# Patient Record
Sex: Female | Born: 1960 | Race: White | Hispanic: No | Marital: Married | Smoking: Never smoker
Health system: Southern US, Community
[De-identification: ages and names within clinical notes are randomized; demographics above are authoritative.]

## PROBLEM LIST (undated history)

## (undated) DIAGNOSIS — K589 Irritable bowel syndrome without diarrhea: Secondary | ICD-10-CM

## (undated) DIAGNOSIS — R87619 Unspecified abnormal cytological findings in specimens from cervix uteri: Secondary | ICD-10-CM

## (undated) DIAGNOSIS — S52509A Unspecified fracture of the lower end of unspecified radius, initial encounter for closed fracture: Secondary | ICD-10-CM

## (undated) DIAGNOSIS — R519 Headache, unspecified: Secondary | ICD-10-CM

## (undated) DIAGNOSIS — M75100 Unspecified rotator cuff tear or rupture of unspecified shoulder, not specified as traumatic: Secondary | ICD-10-CM

## (undated) DIAGNOSIS — IMO0002 Reserved for concepts with insufficient information to code with codable children: Secondary | ICD-10-CM

## (undated) DIAGNOSIS — C4491 Basal cell carcinoma of skin, unspecified: Secondary | ICD-10-CM

## (undated) DIAGNOSIS — M419 Scoliosis, unspecified: Secondary | ICD-10-CM

## (undated) DIAGNOSIS — R51 Headache: Secondary | ICD-10-CM

## (undated) DIAGNOSIS — S82899A Other fracture of unspecified lower leg, initial encounter for closed fracture: Secondary | ICD-10-CM

## (undated) HISTORY — DX: Unspecified fracture of the lower end of unspecified radius, initial encounter for closed fracture: S52.509A

## (undated) HISTORY — DX: Unspecified rotator cuff tear or rupture of unspecified shoulder, not specified as traumatic: M75.100

## (undated) HISTORY — PX: RECTAL SURGERY: SHX760

## (undated) HISTORY — PX: MOHS SURGERY: SUR867

## (undated) HISTORY — DX: Reserved for concepts with insufficient information to code with codable children: IMO0002

## (undated) HISTORY — DX: Irritable bowel syndrome, unspecified: K58.9

## (undated) HISTORY — PX: CERVICAL BIOPSY: SHX590

## (undated) HISTORY — DX: Basal cell carcinoma of skin, unspecified: C44.91

## (undated) HISTORY — PX: OTHER SURGICAL HISTORY: SHX169

## (undated) HISTORY — PX: GYNECOLOGIC CRYOSURGERY: SHX857

## (undated) HISTORY — PX: KNEE CARTILAGE SURGERY: SHX688

## (undated) HISTORY — DX: Other fracture of unspecified lower leg, initial encounter for closed fracture: S82.899A

## (undated) HISTORY — DX: Scoliosis, unspecified: M41.9

## (undated) HISTORY — PX: BREAST BIOPSY: SHX20

## (undated) HISTORY — DX: Unspecified abnormal cytological findings in specimens from cervix uteri: R87.619

---

## 1998-01-24 ENCOUNTER — Other Ambulatory Visit: Admission: RE | Admit: 1998-01-24 | Discharge: 1998-01-24 | Payer: Self-pay | Admitting: *Deleted

## 1999-02-16 ENCOUNTER — Other Ambulatory Visit: Admission: RE | Admit: 1999-02-16 | Discharge: 1999-02-16 | Payer: Self-pay | Admitting: *Deleted

## 2000-03-30 ENCOUNTER — Other Ambulatory Visit: Admission: RE | Admit: 2000-03-30 | Discharge: 2000-03-30 | Payer: Self-pay | Admitting: *Deleted

## 2000-06-22 ENCOUNTER — Ambulatory Visit (HOSPITAL_COMMUNITY): Admission: RE | Admit: 2000-06-22 | Discharge: 2000-06-22 | Payer: Self-pay | Admitting: Gastroenterology

## 2001-03-30 ENCOUNTER — Other Ambulatory Visit: Admission: RE | Admit: 2001-03-30 | Discharge: 2001-03-30 | Payer: Self-pay | Admitting: *Deleted

## 2002-04-02 ENCOUNTER — Other Ambulatory Visit: Admission: RE | Admit: 2002-04-02 | Discharge: 2002-04-02 | Payer: Self-pay | Admitting: *Deleted

## 2002-04-16 ENCOUNTER — Encounter: Payer: Self-pay | Admitting: *Deleted

## 2002-04-16 ENCOUNTER — Encounter: Admission: RE | Admit: 2002-04-16 | Discharge: 2002-04-16 | Payer: Self-pay | Admitting: *Deleted

## 2003-04-08 ENCOUNTER — Other Ambulatory Visit: Admission: RE | Admit: 2003-04-08 | Discharge: 2003-04-08 | Payer: Self-pay | Admitting: *Deleted

## 2004-03-10 ENCOUNTER — Encounter: Admission: RE | Admit: 2004-03-10 | Discharge: 2004-03-10 | Payer: Self-pay | Admitting: *Deleted

## 2004-04-08 ENCOUNTER — Other Ambulatory Visit: Admission: RE | Admit: 2004-04-08 | Discharge: 2004-04-08 | Payer: Self-pay | Admitting: *Deleted

## 2005-04-21 ENCOUNTER — Encounter: Admission: RE | Admit: 2005-04-21 | Discharge: 2005-04-21 | Payer: Self-pay | Admitting: Obstetrics and Gynecology

## 2005-05-24 ENCOUNTER — Encounter: Admission: RE | Admit: 2005-05-24 | Discharge: 2005-05-24 | Payer: Self-pay | Admitting: Obstetrics and Gynecology

## 2006-04-28 ENCOUNTER — Encounter: Admission: RE | Admit: 2006-04-28 | Discharge: 2006-04-28 | Payer: Self-pay | Admitting: Obstetrics & Gynecology

## 2006-06-10 ENCOUNTER — Emergency Department (HOSPITAL_COMMUNITY): Admission: EM | Admit: 2006-06-10 | Discharge: 2006-06-10 | Payer: Self-pay | Admitting: Emergency Medicine

## 2007-04-26 ENCOUNTER — Other Ambulatory Visit: Admission: RE | Admit: 2007-04-26 | Discharge: 2007-04-26 | Payer: Self-pay | Admitting: Obstetrics & Gynecology

## 2007-05-01 ENCOUNTER — Encounter: Admission: RE | Admit: 2007-05-01 | Discharge: 2007-05-01 | Payer: Self-pay | Admitting: Obstetrics & Gynecology

## 2007-06-27 ENCOUNTER — Ambulatory Visit (HOSPITAL_COMMUNITY): Admission: RE | Admit: 2007-06-27 | Discharge: 2007-06-27 | Payer: Self-pay | Admitting: Obstetrics & Gynecology

## 2007-06-27 ENCOUNTER — Encounter: Payer: Self-pay | Admitting: Obstetrics & Gynecology

## 2007-09-18 ENCOUNTER — Encounter: Admission: RE | Admit: 2007-09-18 | Discharge: 2007-09-18 | Payer: Self-pay | Admitting: Obstetrics & Gynecology

## 2007-09-20 ENCOUNTER — Encounter: Admission: RE | Admit: 2007-09-20 | Discharge: 2007-09-20 | Payer: Self-pay | Admitting: Obstetrics & Gynecology

## 2007-09-20 ENCOUNTER — Encounter (INDEPENDENT_AMBULATORY_CARE_PROVIDER_SITE_OTHER): Payer: Self-pay | Admitting: Diagnostic Radiology

## 2008-03-06 ENCOUNTER — Encounter: Admission: RE | Admit: 2008-03-06 | Discharge: 2008-03-06 | Payer: Self-pay | Admitting: Obstetrics & Gynecology

## 2008-04-22 ENCOUNTER — Other Ambulatory Visit: Admission: RE | Admit: 2008-04-22 | Discharge: 2008-04-22 | Payer: Self-pay | Admitting: Obstetrics & Gynecology

## 2008-05-01 ENCOUNTER — Encounter: Admission: RE | Admit: 2008-05-01 | Discharge: 2008-05-01 | Payer: Self-pay | Admitting: Obstetrics & Gynecology

## 2008-09-17 ENCOUNTER — Encounter: Admission: RE | Admit: 2008-09-17 | Discharge: 2008-09-17 | Payer: Self-pay | Admitting: Obstetrics and Gynecology

## 2009-05-05 ENCOUNTER — Encounter: Admission: RE | Admit: 2009-05-05 | Discharge: 2009-05-05 | Payer: Self-pay | Admitting: Obstetrics & Gynecology

## 2010-04-13 ENCOUNTER — Other Ambulatory Visit: Payer: Self-pay | Admitting: Obstetrics & Gynecology

## 2010-04-13 DIAGNOSIS — Z1231 Encounter for screening mammogram for malignant neoplasm of breast: Secondary | ICD-10-CM

## 2010-05-12 ENCOUNTER — Inpatient Hospital Stay: Admission: RE | Admit: 2010-05-12 | Payer: Self-pay | Source: Ambulatory Visit

## 2010-05-14 ENCOUNTER — Ambulatory Visit
Admission: RE | Admit: 2010-05-14 | Discharge: 2010-05-14 | Disposition: A | Discharge status: Home / Self Care | Payer: BC Managed Care – PPO | Source: Ambulatory Visit | Attending: Obstetrics & Gynecology | Admitting: Obstetrics & Gynecology

## 2010-05-14 DIAGNOSIS — Z1231 Encounter for screening mammogram for malignant neoplasm of breast: Secondary | ICD-10-CM

## 2010-05-19 NOTE — Op Note (Signed)
NAMETAYVA, EASTERDAY              ACCOUNT NO.:  [id number]   MEDICAL RECORD NO.:  [id number]           PATIENT TYPE:   LOCATION:                                 FACILITY:   PHYSICIAN:  M. Leda Quail, MD  DATE OF BIRTH:  Sep 23, 1960   DATE OF PROCEDURE:  DATE OF DISCHARGE:                               OPERATIVE REPORT   PREOPERATIVE DIAGNOSES:  20. A 50 year old, G2, P86, A1 married white female with menorrhagia.  2. Endometrial polyp.  3. Undesired fertility with her spouse having had a vasectomy.   POSTOPERATIVE DIAGNOSES:  41. 50 year old, G2, P38, A1 married white female with menorrhagia.  2. Endometrial polyp.  3. Undesired fertility with her spouse having had a vasectomy.   PROCEDURE:  Hydrothermal ablation with a hysteroscopy and polyp  resection.   SURGEON:  M. Leda Quail, MD   ASSISTANT:  OR staff.   ANESTHESIA:  LMA.   FINDINGS:  Polyp on the posterior side of the uterus.   SPECIMENS:  Polyp sent to pathology.   EBL:  Minimal.   FLUIDS:  1000 mL of LR.   URINE OUTPUT:  100 mL clear urine, drained with Foley catheter at the  beginning of procedure.   COMPLICATIONS:  None.   INDICATIONS:  Mrs. Castelli is a 50 year old, G2, P64, A1 married white  female with menorrhagia who has been evaluated with a sonohysterogram.  On a sonohysterogram, there was a small endometrial polyp that was  noted.  She did not have any evidence of fibroids or adenomyosis.  We  talked about  treatment options and she felt that an endometrial  ablation was best option for her.  I did recommend pretreatment for 1  month with Depo-Lupron.  On a followup ultrasound as a preop, the  endometrial thickness was quite thin.  There was not clear evidence of  polyp and I felt that we would probably address it with Depo-Lupron.  An  informed consent was present on her chart.  She is ready to proceed.  Her preoperative endometrial biopsy has been performed in May 2009 and  it showed  secretory endometrium.   PROCEDURE:  The patient taken to the operating room.  She was placed in  the supine position.  An LMA anesthesia was then administered by an  Anesthesia without difficulty.  The patient's legs were positioned in  the low lithotomy position in the [place] stirrups.  They were then lifted  to the high lithotomy position.  Perineum, inner thighs, and vagina were  prepped and draped in normal sterile fashion.  Red rubber Foley catheter  was used to drain the bladder of all urine.  A bivalve speculum was  placed in the vagina.  The anterior lip of the cervix was well  visualized.  It was grasped with single-tooth tenaculum.  The uterus was  quite anteflexed.  The cervix was sounded to 8-1/2 cm.  The cervix was  then dilated with Shawnie Pons dilators up to a #21.  At this point, attempt  was made to pass 5-mm hysteroscope with HEA apparatus attached to it  through the  endocervical canal.  This was unsuccessful.  Cervix was then  dilated up to #23.  At this point, the hysteroscope with the HEA  apparatus attach could be slowly passed through the cervix with a good  seal noted.  The cavity was visualized.  Tubal ostia were noted and  photo documentation was made.  The small polyp was noted and photo  documentation was made.  The tip of the scope was then placed towards  the lower uterine segment.  A heating cycle was performed at this point.  The reservoir was initially at 85 mL and reset at 78 mL once fluid was  to ablation temperature of greater than 81 degrees Celsius.  A 10-minute  ablation cycle was performed without difficulty.  The endometrial polyp  shrunk very quickly, as the ablation was being performed.  After 1  minute full cycle was complete, the scope was removed.  A polyp forceps  was obtained and the polyp was grasped and removed with 2 passes of the  polyp forceps.  The hysteroscope was then used to revisualize  endometrial cavity and absence of the polyp was  noted.  Photo  documentation was made.  At this point, the procedure was ended.  The  patient tolerated the procedure well.  Please note, at the beginning of  the procedure, a paracervical block with 1% lidocaine instilled at 3, 6,  9, and 12 o'clock positions was performed.  A 10 mL total was instilled.  At the end of this procedure, sponge, lap, and instrument counts were  correct x2.  There were no needles used on the field.  The Betadine prep  was cleansed off the skin and the patient's legs were positioned back to  the supine position.  She was awakened from anesthesia without  difficulty, and taken to Recovery Room in stable condition.      Lum Keas, MD  Electronically Signed     MSM/MEDQ  D:  06/27/2007  T:  06/28/2007  Job:  [id number]

## 2010-05-22 NOTE — Procedures (Signed)
[facility]  Patient:    Kathryn Burton, Kathryn Burton                     MRN: [id number] Proc. Date: 06/22/00 Adm. Date:  [id number] Attending:  Charna Elizabeth CC:         Andres Ege, M.D.   Procedure Report  DATE OF BIRTH:  October 10, 1959  REFERRING PHYSICIAN:  Andres Ege, M.D.  PROCEDURE PERFORMED:  Colonoscopy.  ENDOSCOPIST:  Anselmo Rod, M.D.  INSTRUMENT USED:  Olympus video colonoscope.  INDICATIONS FOR PROCEDURE:  Family history of colon cancer in a 50 year old white female, rule out colonic polyps, masses, hemorrhoids, etc.  PREPROCEDURE PREPARATION:  Informed consent was procured from the patient. The patient was fasted for eight hours prior to the procedure and prepped with a bottle of magnesium citrate and a gallon of NuLytely the night prior to the procedure.  PREPROCEDURE PHYSICAL:  The patient had stable vital signs.  Neck supple. Chest clear to auscultation.  S1, S2 regular.  Abdomen soft with normal abdominal bowel sounds.  DESCRIPTION OF PROCEDURE:  The patient was placed in the left lateral decubitus position and sedated with 70 mg of Demerol and 9 mg of Versed intravenously.  Once the patient was adequately sedated and maintained on low-flow oxygen and continuous cardiac monitoring, the Olympus video colonoscope was advanced from the rectum to the cecum without difficulty.  The entire colonic mucosa appeared healthy with no evidence of erosions, ulcerations, masses, polyps or hemorrhoids.  The ileocecal valve and appendicular orifice were clearly visualized and photographed.  The patient tolerated the procedure well without complication.  IMPRESSION:  Healthy-appearing colon up to the cecum.  Ileocecal valve and appendicular orifice were clearly visualized and photographed.  RECOMMENDATIONS: 1. Continue high fiber diet. 2. Outpatient follow-up in the next four weeks. 3. repeat colorectal cancer screening in  the next years unless the patient    were to develop any abnormal symptoms in the interim. DD:  06/22/00 TD:  06/22/00 Job: [id number]

## 2010-10-01 LAB — URINE MICROSCOPIC-ADD ON

## 2010-10-01 LAB — PREGNANCY, URINE: Preg Test, Ur: NEGATIVE

## 2010-10-01 LAB — URINALYSIS, ROUTINE W REFLEX MICROSCOPIC
Bilirubin Urine: NEGATIVE
Glucose, UA: NEGATIVE
Protein, ur: NEGATIVE
Specific Gravity, Urine: 1.025
pH: 5.5

## 2010-10-01 LAB — CBC: HCT: 43.2

## 2011-04-06 ENCOUNTER — Other Ambulatory Visit: Payer: Self-pay | Admitting: Obstetrics & Gynecology

## 2011-04-06 DIAGNOSIS — Z1231 Encounter for screening mammogram for malignant neoplasm of breast: Secondary | ICD-10-CM

## 2011-05-17 ENCOUNTER — Ambulatory Visit
Admission: RE | Admit: 2011-05-17 | Discharge: 2011-05-17 | Disposition: A | Discharge status: Home / Self Care | Payer: BC Managed Care – PPO | Source: Ambulatory Visit | Attending: Obstetrics & Gynecology | Admitting: Obstetrics & Gynecology

## 2011-05-17 DIAGNOSIS — Z1231 Encounter for screening mammogram for malignant neoplasm of breast: Secondary | ICD-10-CM

## 2011-12-01 ENCOUNTER — Ambulatory Visit: Payer: BC Managed Care – PPO | Admitting: Orthopedic Surgery

## 2012-05-17 ENCOUNTER — Other Ambulatory Visit: Payer: Self-pay

## 2012-05-17 DIAGNOSIS — Z1231 Encounter for screening mammogram for malignant neoplasm of breast: Secondary | ICD-10-CM

## 2012-06-22 ENCOUNTER — Ambulatory Visit
Admission: RE | Admit: 2012-06-22 | Discharge: 2012-06-22 | Disposition: A | Discharge status: Home / Self Care | Payer: BC Managed Care – PPO | Source: Ambulatory Visit

## 2012-06-22 DIAGNOSIS — Z1231 Encounter for screening mammogram for malignant neoplasm of breast: Secondary | ICD-10-CM

## 2012-09-21 ENCOUNTER — Encounter: Payer: Self-pay | Admitting: Obstetrics & Gynecology

## 2012-11-15 ENCOUNTER — Ambulatory Visit: Payer: Self-pay | Admitting: Obstetrics & Gynecology

## 2012-11-17 ENCOUNTER — Encounter: Payer: Self-pay | Admitting: Obstetrics & Gynecology

## 2012-11-17 ENCOUNTER — Ambulatory Visit: Payer: Self-pay | Admitting: Obstetrics & Gynecology

## 2012-11-20 ENCOUNTER — Encounter: Payer: Self-pay | Admitting: Obstetrics & Gynecology

## 2012-11-20 ENCOUNTER — Ambulatory Visit (INDEPENDENT_AMBULATORY_CARE_PROVIDER_SITE_OTHER): Payer: BC Managed Care – PPO | Admitting: Obstetrics & Gynecology

## 2012-11-20 VITALS — BP 100/64 | HR 64 | Resp 16 | Ht 65.25 in | Wt 128.2 lb

## 2012-11-20 DIAGNOSIS — Z0000 Encounter for general adult medical examination without abnormal findings: Secondary | ICD-10-CM

## 2012-11-20 DIAGNOSIS — Z01419 Encounter for gynecological examination (general) (routine) without abnormal findings: Secondary | ICD-10-CM

## 2012-11-20 NOTE — Progress Notes (Signed)
52 y.o. G2P1 MarriedCaucasianF here for annual exam.  No cycle in four months.  Scant cycle about every six months.  Very little bleeding.  Hot flashes are at times, very hard for her.  Using estroven.  Does help.  Doesn't really want anything else for this.    Son just got married last weekend.  Now ready to proceed with shoulder surgery.  Going to retire in June.    Patient's last menstrual period was 08/04/2012.          Sexually active: yes  The current method of family planning is none.    Exercising: yes  jogging 3-5 x weekly Smoker:  no  Health Maintenance: Pap:  09/29/11 WNL/negative HR HPV History of abnormal Pap:  yes MMG:06/22/12 normal Colonoscopy:  Years ago BMD:   none TDaP:  9/13 Screening Labs: PCP, Hb today: PCP, Urine today: negative   reports that she has never smoked. She has never used smokeless tobacco. She reports that she does not drink alcohol or use illicit drugs.  Past Medical History  Diagnosis Date  . IBS (irritable bowel syndrome)   . Scoliosis   . Abnormal Pap smear   . Basal cell carcinoma   . Torn rotator cuff     right shoulder-had cortisone injections    Past Surgical History  Procedure Laterality Date  . Knee cartilage surgery      right knee  . Rectal surgery      posterior/rectal sphincter repair  . Gynecologic cryosurgery      CIN I  . Cervical biopsy    . Mohs surgery    . Hta      polyp resection  . Breast biopsy      Current Outpatient Prescriptions  Medication Sig Dispense Refill  . diclofenac (VOLTAREN) 75 MG EC tablet as needed.      . Nutritional Supplements (ESTROVEN PO) Take by mouth. With multivitamin      . Probiotic Product (PROBIOTIC DAILY PO) Take by mouth.      . Acetaminophen (TYLENOL PO) Take by mouth as needed.      Marland Kitchen ibuprofen (ADVIL,MOTRIN) 200 MG tablet Take 200 mg by mouth every 6 (six) hours as needed.       No current facility-administered medications for this visit.    Family History  Problem  Relation Age of Onset  . Breast cancer      maternal aunt, 2nd cousin  . Coronary artery disease Father 72    ROS:  Pertinent items are noted in HPI.  Otherwise, a comprehensive ROS was negative.  Exam:   BP 100/64  Pulse 64  Resp 16  Ht 5' 5.25" (1.657 m)  Wt 128 lb 3.2 oz (58.151 kg)  BMI 21.18 kg/m2  LMP 08/04/2012  Weight change: -7lbs  Height: 5' 5.25" (165.7 cm)  Ht Readings from Last 3 Encounters:  11/20/12 5' 5.25" (1.657 m)    General appearance: alert, cooperative and appears stated age Head: Normocephalic, without obvious abnormality, atraumatic Neck: no adenopathy, supple, symmetrical, trachea midline and thyroid normal to inspection and palpation Lungs: clear to auscultation bilaterally Breasts: normal appearance, no masses or tenderness Heart: regular rate and rhythm Abdomen: soft, non-tender; bowel sounds normal; no masses,  no organomegaly Extremities: extremities normal, atraumatic, no cyanosis or edema Skin: Skin color, texture, turgor normal. No rashes or lesions Lymph nodes: Cervical, supraclavicular, and axillary nodes normal. No abnormal inguinal nodes palpated Neurologic: Grossly normal   Pelvic: External genitalia:  no lesions  Urethra:  normal appearing urethra with no masses, tenderness or lesions              Bartholins and Skenes: normal                 Vagina: normal appearing vagina with normal color and discharge, no lesions              Cervix: no lesions              Pap taken: no Bimanual Exam:  Uterus:  normal size, contour, position, consistency, mobility, non-tender              Adnexa: normal adnexa and no mass, fullness, tenderness               Rectovaginal: Confirms               Anus:  normal sphincter tone, no lesions  A:  Well Woman with normal exam Amenorrhea with hx of HTA H/O breast biopsy Shoulder issues--possible rotator cuff Fecal incontinence  P:   Mammogram yearly.  Grade 4 dense breasts. pap smear  with neg HR HPV 9/13. Pt to call when ready this next year for colonoscopy. Labs at work.  Copies in EPIC. Tdap last year. return annually or prn  An After Visit Summary was printed and given to the patient.

## 2012-11-20 NOTE — Patient Instructions (Signed)

## 2012-12-20 ENCOUNTER — Telehealth: Payer: Self-pay | Admitting: Obstetrics & Gynecology

## 2012-12-20 DIAGNOSIS — M25511 Pain in right shoulder: Secondary | ICD-10-CM

## 2012-12-20 NOTE — Telephone Encounter (Signed)
Dr. Hyacinth Meeker, should I enter in referral for shoulder?

## 2012-12-20 NOTE — Telephone Encounter (Signed)
Patient is waiting hear about a referral for her shoulder.

## 2012-12-21 NOTE — Telephone Encounter (Signed)
Spoke with patient and advised I was waiting to hear from Dr. Hyacinth Meeker. She is okay with waiting as she knows that she was waiting to hear from Dr. Hyacinth Meeker regarding who she personally suggests.

## 2012-12-22 NOTE — Telephone Encounter (Signed)
Don't think I can do anything about that but Dr. Juanda Chance is a female and is a good gastroenterologist.  I would suggest her.

## 2012-12-22 NOTE — Telephone Encounter (Signed)
Dr. Hyacinth Meeker, patient states she is ready to go forward with her colonoscopy, states previous colonoscopy with Dr. Loreta Ave and wanted to be seen there. Called Dr. Kenna Gilbert office and state she was discharged from practice in 08/2005 after patient called and stated that she was being seen by another GI provider after getting a reminder for a colonoscopy in 05/2005. What do you recommend.

## 2012-12-22 NOTE — Telephone Encounter (Signed)
Can you see if can make appt for Dr. Amanda Pea at [facility] at [address]?

## 2012-12-22 NOTE — Telephone Encounter (Signed)
Spoke with [facility]. Appointment scheduled for 02/19/13 at 0815 with arrival at 0745 with Dr. Amanda Pea. Advised patient to have records transferred and that would make consult appointment easier for patient.   Patient is agreeable to date/time/location. Advised she is on wait list for earlier appointment.

## 2012-12-27 NOTE — Telephone Encounter (Signed)
LMTCB

## 2013-01-01 ENCOUNTER — Telehealth: Payer: Self-pay | Admitting: Obstetrics & Gynecology

## 2013-01-01 NOTE — Telephone Encounter (Signed)
Patient States she is returning a  Nurses call dont see an open phone note.

## 2013-01-01 NOTE — Telephone Encounter (Signed)
Spoke with patient and referral for Dr. Juanda Chance sent. Patient would like female only provider.

## 2013-01-01 NOTE — Addendum Note (Signed)
Addended by: Joeseph Amor on: 01/01/2013 12:13 PM   Modules accepted: Orders

## 2013-01-01 NOTE — Telephone Encounter (Signed)
Patient is returning call, info regarding colonoscopy appointment in previous phone note.

## 2013-01-01 NOTE — Telephone Encounter (Signed)
Patient called back. Advised was dc from Dr. Loreta Ave, advised if had questions regarding going back to see Dr. Loreta Ave should call her office directly. Patient is agreeable to referral for Dr. Juanda Chance and referral entered to work que.

## 2013-01-03 ENCOUNTER — Encounter: Payer: Self-pay | Admitting: Internal Medicine

## 2013-02-23 ENCOUNTER — Encounter: Payer: Self-pay | Admitting: Internal Medicine

## 2013-02-23 ENCOUNTER — Ambulatory Visit (AMBULATORY_SURGERY_CENTER): Payer: Self-pay | Admitting: *Deleted

## 2013-02-23 VITALS — Ht 65.5 in | Wt 132.2 lb

## 2013-02-23 DIAGNOSIS — Z1211 Encounter for screening for malignant neoplasm of colon: Secondary | ICD-10-CM

## 2013-02-23 MED ORDER — PEG-KCL-NACL-NASULF-NA ASC-C 100 G PO SOLR: ORAL | Status: DC

## 2013-02-23 NOTE — Progress Notes (Signed)
No egg or soy allergy  Release of info (for Dr. Lorie Apley office) filled out and given to [employer]

## 2013-03-02 ENCOUNTER — Telehealth: Payer: Self-pay | Admitting: Internal Medicine

## 2013-03-02 NOTE — Telephone Encounter (Signed)
Left pt message at that number: OK to take Turmeric before procedure.

## 2013-03-09 ENCOUNTER — Encounter: Payer: Self-pay | Admitting: Internal Medicine

## 2013-03-09 ENCOUNTER — Ambulatory Visit (AMBULATORY_SURGERY_CENTER): Payer: BC Managed Care – PPO | Admitting: Internal Medicine

## 2013-03-09 VITALS — BP 119/89 | HR 77 | Temp 97.6°F | Resp 15 | Ht 65.5 in | Wt 132.0 lb

## 2013-03-09 DIAGNOSIS — Z1211 Encounter for screening for malignant neoplasm of colon: Secondary | ICD-10-CM

## 2013-03-09 MED ORDER — SODIUM CHLORIDE 0.9 % IV SOLN: 500.0000 mL | INTRAVENOUS | Status: DC

## 2013-03-09 NOTE — Progress Notes (Signed)
Report to pacu rn, vss, bbs=clear 

## 2013-03-09 NOTE — Patient Instructions (Signed)
YOU HAD AN ENDOSCOPIC PROCEDURE TODAY AT THE [facility]: Refer to the procedure report that was given to you for any specific questions about what was found during the examination.  If the procedure report does not answer your questions, please call your gastroenterologist to clarify.  If you requested that your care partner not be given the details of your procedure findings, then the procedure report has been included in a sealed envelope for you to review at your convenience later.  YOU SHOULD EXPECT: Some feelings of bloating in the abdomen. Passage of more gas than usual.  Walking can help get rid of the air that was put into your GI tract during the procedure and reduce the bloating. If you had a lower endoscopy (such as a colonoscopy or flexible sigmoidoscopy) you may notice spotting of blood in your stool or on the toilet paper. If you underwent a bowel prep for your procedure, then you may not have a normal bowel movement for a few days.  DIET: Your first meal following the procedure should be a light meal and then it is ok to progress to your normal diet.  A half-sandwich or bowl of soup is an example of a good first meal.  Heavy or fried foods are harder to digest and may make you feel nauseous or bloated.  Likewise meals heavy in dairy and vegetables can cause extra gas to form and this can also increase the bloating.  Drink plenty of fluids but you should avoid alcoholic beverages for 24 hours.  ACTIVITY: Your care partner should take you home directly after the procedure.  You should plan to take it easy, moving slowly for the rest of the day.  You can resume normal activity the day after the procedure however you should NOT DRIVE or use heavy machinery for 24 hours (because of the sedation medicines used during the test).    SYMPTOMS TO REPORT IMMEDIATELY: A gastroenterologist can be reached at any hour.  During normal business hours, 8:30 AM to 5:00 PM Monday through Friday,  call [phone].  After hours and on weekends, please call the GI answering service at [phone] who will take a message and have the physician on call contact you.   Following lower endoscopy (colonoscopy or flexible sigmoidoscopy):  Excessive amounts of blood in the stool  Significant tenderness or worsening of abdominal pains  Swelling of the abdomen that is new, acute  Fever of 100F or higher  FOLLOW UP: If any biopsies were taken you will be contacted by phone or by letter within the next 1-3 weeks.  Call your gastroenterologist if you have not heard about the biopsies in 3 weeks.  Our staff will call the home number listed on your records the next business day following your procedure to check on you and address any questions or concerns that you may have at that time regarding the information given to you following your procedure. This is a courtesy call and so if there is no answer at the home number and we have not heard from you through the emergency physician on call, we will assume that you have returned to your regular daily activities without incident.  SIGNATURES/CONFIDENTIALITY: You and/or your care partner have signed paperwork which will be entered into your electronic medical record.  These signatures attest to the fact that that the information above on your After Visit Summary has been reviewed and is understood.  Full responsibility of the confidentiality of this   discharge information lies with you and/or your care-partner.  Recommendations High fiber diet  Repeat colonoscopy in 10 years

## 2013-03-09 NOTE — Progress Notes (Signed)
Patient denies any allergies to eggs or soy. Patient denies any problems with anesthesia.  

## 2013-03-09 NOTE — Op Note (Signed)
[place]  [address]. [place], [address]   COLONOSCOPY PROCEDURE REPORT  PATIENT: Kathryn Burton, Kathryn Burton  MR#: [id number] BIRTHDATE: 1960-10-12 , 52  yrs. old GENDER: Female ENDOSCOPIST: Lafayette Dragon, MD REFERRED IW:[id number] Sabra Heck, M.D., Dr Orson Ape PROCEDURE DATE:  03/09/2013 PROCEDURE:   Colonoscopy, screening First Screening Colonoscopy - Avg.  risk and is 50 yrs.  old or older - No.  Prior Negative Screening - Now for repeat screening. 10 or more years since last screening  History of Adenoma - Now for follow-up colonoscopy & has been > or = to 3 yrs.  N/A  Polyps Removed Today? No.  Recommend repeat exam, <10 yrs? No. ASA CLASS:   Class I INDICATIONS:Average risk patient for colon cancer and titers screening colonoscopy 11 years ago was normal. MEDICATIONS: MAC sedation, administered by CRNA and Propofol (Diprivan) 220 mg IV  DESCRIPTION OF PROCEDURE:   After the risks benefits and alternatives of the procedure were thoroughly explained, informed consent was obtained.  A digital rectal exam revealed no abnormalities of the rectum.   The LB [id number]-[id number]  endoscope was introduced through the anus and advanced to the cecum, which was identified by both the appendix and ileocecal valve. No adverse events experienced.   The quality of the prep was excellent, using MoviPrep  The instrument was then slowly withdrawn as the colon was fully examined.      COLON FINDINGS: A normal appearing cecum, ileocecal valve, and appendiceal orifice were identified.  The ascending, hepatic flexure, transverse, splenic flexure, descending, sigmoid colon and rectum appeared unremarkable.  No polyps or cancers were seen. Retroflexed views revealed no abnormalities. The time to cecum=4 minutes 02 seconds.  Withdrawal time=8 minutes 120 seconds.  The scope was withdrawn and the procedure completed. COMPLICATIONS: There were no complications.  ENDOSCOPIC  IMPRESSION: Normal colon  RECOMMENDATIONS: high fiber diet Recall colonoscopy in 10 years   eSigned:  Lafayette Dragon, MD 03/09/2013 8:57 AM   cc:   PATIENT NAME:  Kathryn Burton, Kathryn Burton MR#: [id number]

## 2013-03-12 ENCOUNTER — Telehealth: Payer: Self-pay

## 2013-03-12 NOTE — Telephone Encounter (Signed)
Left a message on the answering machine # [phone] for the pt to call if any questions or concerns. Maw

## 2013-05-24 ENCOUNTER — Other Ambulatory Visit: Payer: Self-pay

## 2013-05-24 DIAGNOSIS — Z1231 Encounter for screening mammogram for malignant neoplasm of breast: Secondary | ICD-10-CM

## 2013-07-09 ENCOUNTER — Ambulatory Visit
Admission: RE | Admit: 2013-07-09 | Discharge: 2013-07-09 | Disposition: A | Discharge status: Home / Self Care | Payer: BC Managed Care – PPO | Source: Ambulatory Visit

## 2013-07-09 ENCOUNTER — Encounter (INDEPENDENT_AMBULATORY_CARE_PROVIDER_SITE_OTHER): Payer: Self-pay

## 2013-07-09 DIAGNOSIS — Z1231 Encounter for screening mammogram for malignant neoplasm of breast: Secondary | ICD-10-CM

## 2013-07-10 ENCOUNTER — Encounter: Payer: Self-pay | Admitting: Obstetrics & Gynecology

## 2013-07-11 ENCOUNTER — Other Ambulatory Visit: Payer: Self-pay | Admitting: Obstetrics & Gynecology

## 2013-07-11 DIAGNOSIS — R928 Other abnormal and inconclusive findings on diagnostic imaging of breast: Secondary | ICD-10-CM

## 2013-07-18 ENCOUNTER — Ambulatory Visit
Admission: RE | Admit: 2013-07-18 | Discharge: 2013-07-18 | Disposition: A | Discharge status: Home / Self Care | Payer: BC Managed Care – PPO | Source: Ambulatory Visit | Attending: Obstetrics & Gynecology | Admitting: Obstetrics & Gynecology

## 2013-07-18 ENCOUNTER — Other Ambulatory Visit: Payer: Self-pay | Admitting: Obstetrics & Gynecology

## 2013-07-18 DIAGNOSIS — R928 Other abnormal and inconclusive findings on diagnostic imaging of breast: Secondary | ICD-10-CM

## 2013-10-17 DIAGNOSIS — Z0289 Encounter for other administrative examinations: Secondary | ICD-10-CM

## 2013-11-05 ENCOUNTER — Encounter: Payer: Self-pay | Admitting: Internal Medicine

## 2013-11-23 ENCOUNTER — Ambulatory Visit: Payer: BC Managed Care – PPO | Admitting: Obstetrics & Gynecology

## 2013-12-25 ENCOUNTER — Encounter: Payer: Self-pay | Admitting: Obstetrics & Gynecology

## 2013-12-26 ENCOUNTER — Encounter: Payer: Self-pay | Admitting: Obstetrics & Gynecology

## 2013-12-26 ENCOUNTER — Ambulatory Visit (INDEPENDENT_AMBULATORY_CARE_PROVIDER_SITE_OTHER): Payer: BC Managed Care – PPO | Admitting: Obstetrics & Gynecology

## 2013-12-26 VITALS — BP 110/60 | HR 76 | Ht 66.0 in | Wt 140.2 lb

## 2013-12-26 DIAGNOSIS — Z01419 Encounter for gynecological examination (general) (routine) without abnormal findings: Secondary | ICD-10-CM

## 2013-12-26 DIAGNOSIS — Z124 Encounter for screening for malignant neoplasm of cervix: Secondary | ICD-10-CM

## 2013-12-26 NOTE — Progress Notes (Signed)
53 y.o. G2P1 MarriedCaucasianF here for annual exam.  No vaginal bleeding.  Father this year having more issues with afib and shingles in his right eye.  Was taking Estroven with [facility]VI with good success but pt now having trouble finding this one.  Reviewed available products with her today.  Not interested in HRT.  Does have a rotator cuff tear.  Seeing Dr. Amedeo Plenty.  Trying to avoid surgery.  No LMP recorded. Patient has had an ablation.          Sexually active: Yes.    The current method of family planning is Ablation.    Exercising: Yes.    aerobics Smoker:  yes  Health Maintenance: Pap: 09/29/11 NEG HR HPV  History of abnormal Pap:  yes MMG:  07/09/13 Bi-Rads 0; 07/18/13 Bi-Rads 1: Negative; Left Breast Ultrasound Bi-Rads 1: Negative Colonoscopy:  03/09/13 Normal f/u in 10 years, Dr. Olevia Perches TDaP:  9/13  Screening Labs: no done at work   reports that she has never smoked. She has never used smokeless tobacco. She reports that she does not drink alcohol or use illicit drugs.  Past Medical History  Diagnosis Date  . IBS (irritable bowel syndrome)   . Scoliosis   . Abnormal Pap smear   . Basal cell carcinoma   . Torn rotator cuff     right shoulder-had cortisone injections    Past Surgical History  Procedure Laterality Date  . Knee cartilage surgery      right knee  . Rectal surgery      posterior/rectal sphincter repair  . Gynecologic cryosurgery      CIN I  . Cervical biopsy    . Mohs surgery    . Hta      polyp resection  . Breast biopsy      right    Current Outpatient Prescriptions  Medication Sig Dispense Refill  . Acetaminophen (TYLENOL PO) Take by mouth as needed.    . diclofenac (VOLTAREN) 75 MG EC tablet as needed.    Marland Kitchen ibuprofen (ADVIL,MOTRIN) 200 MG tablet Take 200 mg by mouth every 6 (six) hours as needed.    . Nutritional Supplements (ESTROVEN PO) Take by mouth. With multivitamin    . Probiotic Product (PROBIOTIC DAILY PO) Take by mouth.    . TURMERIC PO Take  by mouth daily.     No current facility-administered medications for this visit.    Family History  Problem Relation Age of Onset  . Breast cancer      maternal aunt, 2nd cousin  . Coronary artery disease Father 41  . Colon cancer Paternal Grandmother   . Esophageal cancer Neg Hx   . Stomach cancer Neg Hx   . Rectal cancer Neg Hx     ROS:  Pertinent items are noted in HPI.  Otherwise, a comprehensive ROS was negative.  Exam:   BP 110/60 mmHg  Pulse 76  Ht 5\' 6"  (1.676 m)  Wt 140 lb 3.2 oz (63.594 kg)  BMI 22.64 kg/m2  Weight change: +12#   Height: 5\' 6"  (167.6 cm)  Ht Readings from Last 3 Encounters:  12/26/13 5\' 6"  (1.676 m)  03/09/13 5' 5.5" (1.664 m)  02/23/13 5' 5.5" (1.664 m)    General appearance: alert, cooperative and appears stated age Head: Normocephalic, without obvious abnormality, atraumatic Neck: no adenopathy, supple, symmetrical, trachea midline and thyroid normal to inspection and palpation Lungs: clear to auscultation bilaterally Breasts: normal appearance, no masses or tenderness Heart: regular rate and  rhythm Abdomen: soft, non-tender; bowel sounds normal; no masses,  no organomegaly Extremities: extremities normal, atraumatic, no cyanosis or edema Skin: Skin color, texture, turgor normal. No rashes or lesions Lymph nodes: Cervical, supraclavicular, and axillary nodes normal. No abnormal inguinal nodes palpated Neurologic: Grossly normal   Pelvic: External genitalia:  no lesions              Urethra:  normal appearing urethra with no masses, tenderness or lesions              Bartholins and Skenes: normal                 Vagina: normal appearing vagina with normal color and discharge, no lesions              Cervix: no lesions              Pap taken: Yes.   Bimanual Exam:  Uterus:  normal size, contour, position, consistency, mobility, non-tender              Adnexa: normal adnexa and no mass, fullness, tenderness               Rectovaginal:  Confirms               Anus:  normal sphincter tone, no lesions  Chaperone was present for exam.  A:  Well Woman with normal exam Amenorrhea with hx of HTA H/O breast biopsy Fecal incontinence  P: Mammogram yearly. Grade 4 dense breasts.  Pt aware should consider 3D MMG. pap smear with neg HR HPV 9/13.  Pap today. Labs at work. Copies will be placed in EPIC. return annually or prn

## 2013-12-31 ENCOUNTER — Encounter: Payer: Self-pay | Admitting: Obstetrics & Gynecology

## 2014-01-02 LAB — IPS PAP TEST WITH REFLEX TO HPV

## 2014-01-23 ENCOUNTER — Other Ambulatory Visit: Payer: Self-pay | Admitting: Dermatology

## 2014-02-13 ENCOUNTER — Encounter: Payer: Self-pay | Admitting: Obstetrics & Gynecology

## 2014-05-23 ENCOUNTER — Ambulatory Visit: Payer: BC Managed Care – PPO | Admitting: Obstetrics & Gynecology

## 2014-06-10 ENCOUNTER — Other Ambulatory Visit: Payer: Self-pay

## 2014-06-10 DIAGNOSIS — Z1231 Encounter for screening mammogram for malignant neoplasm of breast: Secondary | ICD-10-CM

## 2014-07-22 ENCOUNTER — Ambulatory Visit
Admission: RE | Admit: 2014-07-22 | Discharge: 2014-07-22 | Disposition: A | Discharge status: Home / Self Care | Payer: BC Managed Care – PPO | Source: Ambulatory Visit

## 2014-07-22 DIAGNOSIS — Z1231 Encounter for screening mammogram for malignant neoplasm of breast: Secondary | ICD-10-CM

## 2014-07-23 ENCOUNTER — Other Ambulatory Visit: Payer: Self-pay | Admitting: Obstetrics & Gynecology

## 2014-07-23 DIAGNOSIS — R928 Other abnormal and inconclusive findings on diagnostic imaging of breast: Secondary | ICD-10-CM

## 2014-07-26 ENCOUNTER — Ambulatory Visit
Admission: RE | Admit: 2014-07-26 | Discharge: 2014-07-26 | Disposition: A | Discharge status: Home / Self Care | Payer: BC Managed Care – PPO | Source: Ambulatory Visit | Attending: Obstetrics & Gynecology | Admitting: Obstetrics & Gynecology

## 2014-07-26 DIAGNOSIS — R928 Other abnormal and inconclusive findings on diagnostic imaging of breast: Secondary | ICD-10-CM

## 2014-12-25 ENCOUNTER — Other Ambulatory Visit: Payer: Self-pay | Admitting: Obstetrics & Gynecology

## 2014-12-25 DIAGNOSIS — N6489 Other specified disorders of breast: Secondary | ICD-10-CM

## 2015-01-28 ENCOUNTER — Other Ambulatory Visit: Payer: Self-pay | Admitting: Obstetrics & Gynecology

## 2015-01-28 ENCOUNTER — Ambulatory Visit
Admission: RE | Admit: 2015-01-28 | Discharge: 2015-01-28 | Disposition: A | Discharge status: Home / Self Care | Payer: BC Managed Care – PPO | Source: Ambulatory Visit | Attending: Obstetrics & Gynecology | Admitting: Obstetrics & Gynecology

## 2015-01-28 DIAGNOSIS — N6489 Other specified disorders of breast: Secondary | ICD-10-CM

## 2015-02-05 ENCOUNTER — Ambulatory Visit
Admission: RE | Admit: 2015-02-05 | Discharge: 2015-02-05 | Disposition: A | Discharge status: Home / Self Care | Payer: BC Managed Care – PPO | Source: Ambulatory Visit | Attending: Obstetrics & Gynecology | Admitting: Obstetrics & Gynecology

## 2015-02-05 DIAGNOSIS — N6489 Other specified disorders of breast: Secondary | ICD-10-CM

## 2015-02-06 ENCOUNTER — Encounter: Payer: Self-pay | Admitting: Obstetrics & Gynecology

## 2015-02-06 ENCOUNTER — Ambulatory Visit (INDEPENDENT_AMBULATORY_CARE_PROVIDER_SITE_OTHER): Payer: BC Managed Care – PPO | Admitting: Obstetrics & Gynecology

## 2015-02-06 VITALS — BP 110/60 | HR 86 | Resp 14 | Ht 65.5 in | Wt 136.0 lb

## 2015-02-06 DIAGNOSIS — N952 Postmenopausal atrophic vaginitis: Secondary | ICD-10-CM | POA: Diagnosis not present

## 2015-02-06 DIAGNOSIS — N941 Unspecified dyspareunia: Secondary | ICD-10-CM | POA: Diagnosis not present

## 2015-02-06 DIAGNOSIS — Z01419 Encounter for gynecological examination (general) (routine) without abnormal findings: Secondary | ICD-10-CM

## 2015-02-06 DIAGNOSIS — Z0000 Encounter for general adult medical examination without abnormal findings: Secondary | ICD-10-CM | POA: Diagnosis not present

## 2015-02-06 LAB — POCT URINALYSIS DIPSTICK
Bilirubin, UA: NEGATIVE
GLUCOSE UA: NEGATIVE
Ketones, UA: NEGATIVE
NITRITE UA: NEGATIVE
Protein, UA: NEGATIVE
RBC UA: NEGATIVE
UROBILINOGEN UA: NEGATIVE
pH, UA: 7

## 2015-02-06 NOTE — Progress Notes (Signed)
55 y.o. G2P1 MarriedCaucasianF here for annual exam.  Doing well.  Had a breast biopsy done yesterday.  Waiting for results.  Pt feeling very anxious to get test results.  Checked while pt was here and these results are not back.  Pt wants to discuss vaginal dryness and pain with intercourse.  She has tried OTC products and olive oil without much success.  She does have some anxiety about using vaginal estrogen products due to concerns about estrogen and breast cancer risks.  Reviewed with pt products that are available at this time including vaginal estrogen, oral estrogens.  Pt has family hx of breast cancer in a maternal aunt and second cousin.  She's always been very anxious about using any estrogen product but she feels this is really getting in the way of her marriage and she does want to continue having a sex life, if possible.  After reviewing products and risks with pt, I feel Osphena would be a good option for her but I am anxious about starting this until she has complete follow-up regarding the breast biopsy and then possible excisional biopsy.   Pt also asks about breast surgeons so I share information from other patients.    Denies vaginal bleeding.   No LMP recorded. Patient has had an ablation.          Sexually active: Yes.    The current method of family planning is none.    Exercising: Yes.    Walking Smoker:  no  Health Maintenance: Pap:  12/26/13 Neg. 09/29/11 Neg. HR HPV:neg History of abnormal Pap:  yes MMG:  01/28/15 Left BIRADS4:Suspicious. Needle Bx: results pending.  Colonoscopy: 03/09/13 Normal - repeat 10 years  BMD:   Never TDaP: 09/2011  Screening Labs: PCP (12/15), Hb today: PCP, Urine today: WBC=Small   reports that she has never smoked. She has never used smokeless tobacco. She reports that she does not drink alcohol or use illicit drugs.  Past Medical History  Diagnosis Date  . IBS (irritable bowel syndrome)   . Scoliosis   . Abnormal Pap smear   . Basal  cell carcinoma   . Torn rotator cuff     right shoulder-had cortisone injections    Past Surgical History  Procedure Laterality Date  . Knee cartilage surgery      right knee  . Rectal surgery      posterior/rectal sphincter repair  . Gynecologic cryosurgery      CIN I  . Cervical biopsy    . Mohs surgery    . Hta      polyp resection  . Breast biopsy      right    Current Outpatient Prescriptions  Medication Sig Dispense Refill  . Acetaminophen (TYLENOL PO) Take by mouth as needed.    . Black Cohosh 160 MG CAPS Take by mouth.    Marland Kitchen ibuprofen (ADVIL,MOTRIN) 200 MG tablet Take 200 mg by mouth every 6 (six) hours as needed.    . Probiotic Product (PROBIOTIC DAILY PO) Take by mouth.    . TURMERIC PO Take by mouth daily.     No current facility-administered medications for this visit.    Family History  Problem Relation Age of Onset  . Breast cancer      maternal aunt, 2nd cousin  . Coronary artery disease Father 14  . Colon cancer Paternal Grandmother   . Esophageal cancer Neg Hx   . Stomach cancer Neg Hx   . Rectal cancer Neg  Hx     ROS:  Pertinent items are noted in HPI.  Otherwise, a comprehensive ROS was negative.  Exam:   BP 110/60 mmHg  Pulse 86  Resp 14  Ht 5' 5.5" (1.664 m)  Wt 136 lb (61.689 kg)  BMI 22.28 kg/m2  Weight change: -4#  Height: 5' 5.5" (166.4 cm)  Ht Readings from Last 3 Encounters:  02/06/15 5' 5.5" (1.664 m)  12/26/13 5\' 6"  (1.676 m)  03/09/13 5' 5.5" (1.664 m)    General appearance: alert, cooperative and appears stated age Head: Normocephalic, without obvious abnormality, atraumatic Neck: no adenopathy, supple, symmetrical, trachea midline and thyroid normal to inspection and palpation Lungs: clear to auscultation bilaterally Breasts: normal appearance, no masses or tenderness Heart: regular rate and rhythm Abdomen: soft, non-tender; bowel sounds normal; no masses,  no organomegaly Extremities: extremities normal, atraumatic, no  cyanosis or edema Skin: Skin color, texture, turgor normal. No rashes or lesions Lymph nodes: Cervical, supraclavicular, and axillary nodes normal. No abnormal inguinal nodes palpated Neurologic: Grossly normal   Pelvic: External genitalia:  no lesions              Urethra:  normal appearing urethra with no masses, tenderness or lesions              Bartholins and Skenes: normal                 Vagina: normal appearing vagina with normal color and discharge, no lesions, atrophic              Cervix: no lesions              Pap taken: No. Bimanual Exam:  Uterus:  normal size, contour, position, consistency, mobility, non-tender              Adnexa: normal adnexa and no mass, fullness, tenderness               Rectovaginal: Confirms               Anus:  normal sphincter tone, no lesions  Chaperone was present for exam.  A:  Well Woman with normal exam Amenorrhea with hx of HTA, menopausal now H/O breast biopsy, awaiting biopsy result from yesterday Fecal incontinence that has improved some with bulking agents Family hx of breast cancer in maternal aunt and second cousin Vaginal atrophic changes with dysparuenia  P: Mammogram yearly. Grade 4 dense breasts. Doing 3D MMG. pap smear with neg HR HPV 9/13. neg pap 2015.  No Pap smear today. Consider Osphena if breast evaluation is all negative but will not start this until breast evaluation has been completed.  Pt is comfortable with this. Plan labs next year return annually or prn  In additional to AEX, about 15 minutes spent in discussion about atrophic changes and options for treatment.

## 2015-02-06 NOTE — Patient Instructions (Addendum)
Osphena 60mg  Premarin or estrace vaginal cream 1/2 to 1 gram vaginally two to three times weekly Vagifem 52meq vaginally twice weekly  Dr. Serita Grammes

## 2015-02-09 DIAGNOSIS — N952 Postmenopausal atrophic vaginitis: Secondary | ICD-10-CM | POA: Insufficient documentation

## 2015-02-09 DIAGNOSIS — N941 Unspecified dyspareunia: Secondary | ICD-10-CM | POA: Insufficient documentation

## 2015-02-20 ENCOUNTER — Other Ambulatory Visit: Payer: Self-pay | Admitting: General Surgery

## 2015-02-20 DIAGNOSIS — N6489 Other specified disorders of breast: Secondary | ICD-10-CM

## 2015-02-21 ENCOUNTER — Encounter (HOSPITAL_BASED_OUTPATIENT_CLINIC_OR_DEPARTMENT_OTHER): Payer: Self-pay | Admitting: *Deleted

## 2015-02-21 ENCOUNTER — Other Ambulatory Visit: Payer: Self-pay | Admitting: General Surgery

## 2015-02-21 DIAGNOSIS — N6489 Other specified disorders of breast: Secondary | ICD-10-CM

## 2015-02-25 ENCOUNTER — Ambulatory Visit
Admission: RE | Admit: 2015-02-25 | Discharge: 2015-02-25 | Disposition: A | Discharge status: Home / Self Care | Payer: BC Managed Care – PPO | Source: Ambulatory Visit | Attending: General Surgery | Admitting: General Surgery

## 2015-02-25 ENCOUNTER — Encounter (HOSPITAL_BASED_OUTPATIENT_CLINIC_OR_DEPARTMENT_OTHER): Payer: Self-pay

## 2015-02-25 ENCOUNTER — Ambulatory Visit (HOSPITAL_BASED_OUTPATIENT_CLINIC_OR_DEPARTMENT_OTHER): Payer: BC Managed Care – PPO | Admitting: Anesthesiology

## 2015-02-25 ENCOUNTER — Ambulatory Visit (HOSPITAL_BASED_OUTPATIENT_CLINIC_OR_DEPARTMENT_OTHER)
Admission: RE | Admit: 2015-02-25 | Discharge: 2015-02-25 | Disposition: A | Discharge status: Home / Self Care | Payer: BC Managed Care – PPO | Source: Ambulatory Visit | Attending: General Surgery | Admitting: General Surgery

## 2015-02-25 ENCOUNTER — Encounter (HOSPITAL_BASED_OUTPATIENT_CLINIC_OR_DEPARTMENT_OTHER)
Admission: RE | Disposition: A | Discharge status: Home / Self Care | Payer: Self-pay | Source: Ambulatory Visit | Attending: General Surgery

## 2015-02-25 DIAGNOSIS — M1990 Unspecified osteoarthritis, unspecified site: Secondary | ICD-10-CM | POA: Diagnosis not present

## 2015-02-25 DIAGNOSIS — N6489 Other specified disorders of breast: Secondary | ICD-10-CM

## 2015-02-25 DIAGNOSIS — N63 Unspecified lump in breast: Secondary | ICD-10-CM | POA: Diagnosis present

## 2015-02-25 DIAGNOSIS — Z791 Long term (current) use of non-steroidal anti-inflammatories (NSAID): Secondary | ICD-10-CM | POA: Diagnosis not present

## 2015-02-25 HISTORY — DX: Headache, unspecified: R51.9

## 2015-02-25 HISTORY — PX: BREAST LUMPECTOMY WITH RADIOACTIVE SEED LOCALIZATION: SHX6424

## 2015-02-25 HISTORY — DX: Headache: R51

## 2015-02-25 HISTORY — PX: BREAST EXCISIONAL BIOPSY: SUR124

## 2015-02-25 SURGERY — BREAST LUMPECTOMY WITH RADIOACTIVE SEED LOCALIZATION
Anesthesia: General | Site: Breast | Laterality: Left

## 2015-02-25 MED ORDER — DEXAMETHASONE SODIUM PHOSPHATE 10 MG/ML IJ SOLN
INTRAMUSCULAR | Status: AC
  Filled 2015-02-25: qty 1

## 2015-02-25 MED ORDER — CEFAZOLIN SODIUM-DEXTROSE 2-3 GM-% IV SOLR
INTRAVENOUS | Status: AC
  Filled 2015-02-25: qty 50

## 2015-02-25 MED ORDER — MIDAZOLAM HCL 2 MG/2ML IJ SOLN
1.0000 mg | INTRAMUSCULAR | Status: DC | PRN
  Administered 2015-02-25: 2 mg via INTRAVENOUS

## 2015-02-25 MED ORDER — SCOPOLAMINE 1 MG/3DAYS TD PT72: 1.0000 | MEDICATED_PATCH | Freq: Once | TRANSDERMAL | Status: DC | PRN

## 2015-02-25 MED ORDER — FENTANYL CITRATE (PF) 100 MCG/2ML IJ SOLN
50.0000 ug | INTRAMUSCULAR | Status: DC | PRN
  Administered 2015-02-25: 50 ug via INTRAVENOUS

## 2015-02-25 MED ORDER — GLYCOPYRROLATE 0.2 MG/ML IJ SOLN: 0.2000 mg | Freq: Once | INTRAMUSCULAR | Status: DC | PRN

## 2015-02-25 MED ORDER — ONDANSETRON HCL 4 MG/2ML IJ SOLN
INTRAMUSCULAR | Status: AC
  Filled 2015-02-25: qty 2

## 2015-02-25 MED ORDER — LACTATED RINGERS IV SOLN
INTRAVENOUS | Status: DC
  Administered 2015-02-25 (×3): via INTRAVENOUS

## 2015-02-25 MED ORDER — CEFAZOLIN SODIUM-DEXTROSE 2-3 GM-% IV SOLR
2.0000 g | INTRAVENOUS | Status: AC
  Administered 2015-02-25: 2 g via INTRAVENOUS

## 2015-02-25 MED ORDER — LACTATED RINGERS IV SOLN: 500.0000 mL | INTRAVENOUS | Status: DC

## 2015-02-25 MED ORDER — DEXAMETHASONE SODIUM PHOSPHATE 4 MG/ML IJ SOLN
INTRAMUSCULAR | Status: DC | PRN
  Administered 2015-02-25: 10 mg via INTRAVENOUS

## 2015-02-25 MED ORDER — MIDAZOLAM HCL 2 MG/2ML IJ SOLN
INTRAMUSCULAR | Status: AC
  Filled 2015-02-25: qty 2

## 2015-02-25 MED ORDER — FENTANYL CITRATE (PF) 100 MCG/2ML IJ SOLN
INTRAMUSCULAR | Status: AC
  Filled 2015-02-25: qty 2

## 2015-02-25 MED ORDER — ONDANSETRON HCL 4 MG/2ML IJ SOLN
INTRAMUSCULAR | Status: DC | PRN
  Administered 2015-02-25: 4 mg via INTRAVENOUS

## 2015-02-25 MED ORDER — HYDROCODONE-ACETAMINOPHEN 5-325 MG PO TABS: 1.0000 | ORAL_TABLET | Freq: Four times a day (QID) | ORAL | Status: DC | PRN

## 2015-02-25 MED ORDER — BUPIVACAINE HCL (PF) 0.25 % IJ SOLN
INTRAMUSCULAR | Status: DC | PRN
  Administered 2015-02-25: 10 mL

## 2015-02-25 MED ORDER — PROPOFOL 10 MG/ML IV BOLUS
INTRAVENOUS | Status: DC | PRN
  Administered 2015-02-25: 150 mg via INTRAVENOUS

## 2015-02-25 MED ORDER — LIDOCAINE HCL (CARDIAC) 20 MG/ML IV SOLN
INTRAVENOUS | Status: DC | PRN
  Administered 2015-02-25: 80 mg via INTRAVENOUS

## 2015-02-25 MED ORDER — LIDOCAINE HCL (CARDIAC) 20 MG/ML IV SOLN
INTRAVENOUS | Status: AC
  Filled 2015-02-25: qty 5

## 2015-02-25 MED ORDER — PROPOFOL 10 MG/ML IV BOLUS
INTRAVENOUS | Status: AC
  Filled 2015-02-25: qty 20

## 2015-02-25 SURGICAL SUPPLY — 55 items
APPLIER CLIP 9.375 MED OPEN (MISCELLANEOUS)
BINDER BREAST LRG (GAUZE/BANDAGES/DRESSINGS) IMPLANT
BINDER BREAST MEDIUM (GAUZE/BANDAGES/DRESSINGS) ×2 IMPLANT
BINDER BREAST XLRG (GAUZE/BANDAGES/DRESSINGS) IMPLANT
BINDER BREAST XXLRG (GAUZE/BANDAGES/DRESSINGS) IMPLANT
BLADE SURG 15 STRL LF DISP TIS (BLADE) ×1 IMPLANT
BLADE SURG 15 STRL SS (BLADE) ×1
CANISTER SUC SOCK COL 7IN (MISCELLANEOUS) IMPLANT
CANISTER SUCT 1200ML W/VALVE (MISCELLANEOUS) IMPLANT
CHLORAPREP W/TINT 26ML (MISCELLANEOUS) ×2 IMPLANT
CLIP APPLIE 9.375 MED OPEN (MISCELLANEOUS) IMPLANT
COVER BACK TABLE 60X90IN (DRAPES) ×2 IMPLANT
COVER MAYO STAND STRL (DRAPES) ×2 IMPLANT
COVER PROBE W GEL 5X96 (DRAPES) ×2 IMPLANT
DECANTER SPIKE VIAL GLASS SM (MISCELLANEOUS) IMPLANT
DEVICE DUBIN W/COMP PLATE 8390 (MISCELLANEOUS) ×2 IMPLANT
DRAPE LAPAROSCOPIC ABDOMINAL (DRAPES) ×2 IMPLANT
DRAPE UTILITY XL STRL (DRAPES) ×2 IMPLANT
DRSG TEGADERM 4X4.75 (GAUZE/BANDAGES/DRESSINGS) IMPLANT
ELECT COATED BLADE 2.86 ST (ELECTRODE) ×2 IMPLANT
ELECT REM PT RETURN 9FT ADLT (ELECTROSURGICAL) ×2
ELECTRODE REM PT RTRN 9FT ADLT (ELECTROSURGICAL) ×1 IMPLANT
GLOVE BIO SURGEON STRL SZ7 (GLOVE) ×4 IMPLANT
GLOVE BIOGEL PI IND STRL 6.5 (GLOVE) ×2 IMPLANT
GLOVE BIOGEL PI IND STRL 7.5 (GLOVE) ×1 IMPLANT
GLOVE BIOGEL PI INDICATOR 6.5 (GLOVE) ×2
GLOVE BIOGEL PI INDICATOR 7.5 (GLOVE) ×1
GLOVE ECLIPSE 6.5 STRL STRAW (GLOVE) ×2 IMPLANT
GOWN STRL REUS W/ TWL LRG LVL3 (GOWN DISPOSABLE) ×2 IMPLANT
GOWN STRL REUS W/TWL LRG LVL3 (GOWN DISPOSABLE) ×2
ILLUMINATOR WAVEGUIDE N/F (MISCELLANEOUS) ×2 IMPLANT
KIT MARKER MARGIN INK (KITS) ×2 IMPLANT
LIGHT WAVEGUIDE WIDE FLAT (MISCELLANEOUS) IMPLANT
LIQUID BAND (GAUZE/BANDAGES/DRESSINGS) ×2 IMPLANT
NEEDLE HYPO 25X1 1.5 SAFETY (NEEDLE) ×2 IMPLANT
NS IRRIG 1000ML POUR BTL (IV SOLUTION) IMPLANT
PACK BASIN DAY SURGERY FS (CUSTOM PROCEDURE TRAY) ×2 IMPLANT
PENCIL BUTTON HOLSTER BLD 10FT (ELECTRODE) ×2 IMPLANT
SLEEVE SCD COMPRESS KNEE MED (MISCELLANEOUS) ×2 IMPLANT
SPONGE GAUZE 4X4 12PLY STER LF (GAUZE/BANDAGES/DRESSINGS) IMPLANT
SPONGE LAP 4X18 X RAY DECT (DISPOSABLE) ×2 IMPLANT
STRIP CLOSURE SKIN 1/2X4 (GAUZE/BANDAGES/DRESSINGS) ×2 IMPLANT
SUT MNCRL AB 4-0 PS2 18 (SUTURE) ×2 IMPLANT
SUT MON AB 5-0 PS2 18 (SUTURE) ×2 IMPLANT
SUT SILK 2 0 SH (SUTURE) IMPLANT
SUT VIC AB 2-0 SH 27 (SUTURE) ×1
SUT VIC AB 2-0 SH 27XBRD (SUTURE) ×1 IMPLANT
SUT VIC AB 3-0 SH 27 (SUTURE) ×1
SUT VIC AB 3-0 SH 27X BRD (SUTURE) ×1 IMPLANT
SUT VIC AB 5-0 PS2 18 (SUTURE) IMPLANT
SYR CONTROL 10ML LL (SYRINGE) ×2 IMPLANT
TOWEL OR 17X24 6PK STRL BLUE (TOWEL DISPOSABLE) ×2 IMPLANT
TOWEL OR NON WOVEN STRL DISP B (DISPOSABLE) ×2 IMPLANT
TUBE CONNECTING 20X1/4 (TUBING) IMPLANT
YANKAUER SUCT BULB TIP NO VENT (SUCTIONS) IMPLANT

## 2015-02-25 NOTE — Transfer of Care (Signed)
Immediate Anesthesia Transfer of Care Note  Patient: Kathryn Burton  Procedure(s) Performed: Procedure(s): LEFT BREAST LUMPECTOMY WITH RADIOACTIVE SEED LOCALIZATION (Left)  Patient Location: PACU  Anesthesia Type:General  Level of Consciousness: awake, sedated and responds to stimulation  Airway & Oxygen Therapy: Patient Spontanous Breathing and Patient connected to face mask oxygen  Post-op Assessment: Report given to RN and Post -op Vital signs reviewed and stable  Post vital signs: Reviewed and stable  Last Vitals:  Filed Vitals:   02/25/15 0954  BP: 124/73  Pulse: 82  Temp: 36.7 C  Resp: 16    Complications: No apparent anesthesia complications

## 2015-02-25 NOTE — Anesthesia Postprocedure Evaluation (Signed)
Anesthesia Post Note  Patient: Kathryn Burton  Procedure(s) Performed: Procedure(s) (LRB): LEFT BREAST LUMPECTOMY WITH RADIOACTIVE SEED LOCALIZATION (Left)  Patient location during evaluation: PACU Anesthesia Type: General Level of consciousness: awake and alert Pain management: pain level controlled Vital Signs Assessment: post-procedure vital signs reviewed and stable Respiratory status: spontaneous breathing, nonlabored ventilation and respiratory function stable Cardiovascular status: blood pressure returned to baseline and stable Postop Assessment: no signs of nausea or vomiting Anesthetic complications: no    Last Vitals:  Filed Vitals:   02/25/15 1230 02/25/15 1315  BP: 116/78 117/83  Pulse:    Temp:  36.4 C  Resp:  14    Last Pain: There were no vitals filed for this visit.               Jacquez Sheetz,W. EDMOND

## 2015-02-25 NOTE — Anesthesia Preprocedure Evaluation (Signed)
Anesthesia Evaluation  Patient identified by MRN, date of birth, ID band Patient awake    Reviewed: Allergy & Precautions, NPO status , Patient's Chart, lab work & pertinent test results  History of Anesthesia Complications Negative for: history of anesthetic complications  Airway Mallampati: II  TM Distance: >3 FB Neck ROM: Full    Dental no notable dental hx. (+) Dental Advisory Given   Pulmonary neg pulmonary ROS,    Pulmonary exam normal breath sounds clear to auscultation       Cardiovascular negative cardio ROS Normal cardiovascular exam Rhythm:Regular Rate:Normal     Neuro/Psych  Headaches, negative psych ROS   GI/Hepatic negative GI ROS, Neg liver ROS,   Endo/Other  negative endocrine ROS  Renal/GU negative Renal ROS  negative genitourinary   Musculoskeletal  (+) Arthritis ,   Abdominal   Peds negative pediatric ROS (+)  Hematology negative hematology ROS (+)   Anesthesia Other Findings   Reproductive/Obstetrics negative OB ROS                             Anesthesia Physical Anesthesia Plan  ASA: II  Anesthesia Plan: General   Post-op Pain Management:    Induction: Intravenous  Airway Management Planned: LMA  Additional Equipment:   Intra-op Plan:   Post-operative Plan: Extubation in OR  Informed Consent: I have reviewed the patients History and Physical, chart, labs and discussed the procedure including the risks, benefits and alternatives for the proposed anesthesia with the patient or authorized representative who has indicated his/her understanding and acceptance.   Dental advisory given  Plan Discussed with: CRNA  Anesthesia Plan Comments:         Anesthesia Quick Evaluation

## 2015-02-25 NOTE — Interval H&P Note (Signed)
History and Physical Interval Note:  02/25/2015 10:58 AM  Kathryn Burton  has presented today for surgery, with the diagnosis of left breast radial scar  The various methods of treatment have been discussed with the patient and family. After consideration of risks, benefits and other options for treatment, the patient has consented to  Procedure(s): LEFT BREAST LUMPECTOMY WITH RADIOACTIVE SEED LOCALIZATION (Left) as a surgical intervention .  The patient's history has been reviewed, patient examined, no change in status, stable for surgery.  I have reviewed the patient's chart and labs.  Questions were answered to the patient's satisfaction.     Keri Veale

## 2015-02-25 NOTE — Op Note (Signed)
Preoperative diagnosis: Right breast mass, core biopsy c/w csl Postoperative diagnosis: same as above Procedure: Right breast mass seed guided excisional biopsy Surgeon: Dr Serita Grammes Anesthesia: general EBL: minimal Drains none Complications none Specimen Right breast tissue marked with paint Sponge and needle count was correct to completion disposition to recovery stable  Indications: This is a 53 yof with right breast mammographic abnormality that underwent core biopsy and was a csl.  Her clip is about 1.2 cm away from the lesion/biopsy site.  We discussed options and decided to excise this with seed guidance. The biopsy site not the clip was localized.    Procedure: After informed consent was obtained the patient was taken to the operating room. She was given antibiotics. Sequential compression devices were on her legs. She was then placed under general anesthesia without complication. She was prepped and draped in the standard sterile surgical fashion. A surgical timeout was then performed. I had the mammograms in the room  I infiltrated 1% lidocaine and made a periareolar incision after locating the seed. I then removed the seed and surrounding tissue. The seed was confirmed to be removed by mammography. The clip was not present but I was not going to remove more tissue just to remove this as it had migrated and this is benign lesion for now. Hemostasis was observed. I painted the specimen. . I then closed this with 2-0 vicryl 3-0 Vicryl, and 5-0 Monocryl. Glue was placed over the wound.  She tolerated this well was extubated and transferred to recovery in stable condition

## 2015-02-25 NOTE — H&P (Signed)
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 there is possible distortion in left breast that showed up in July then was followed up now. this area was present in december without [place] correlate and a tomo biopsy was done. The coil clip is 1.3 cm superior to the area. 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 she has no complaints referable to either breast. she has no family history. she is here with her husband today   Other Problems Marjean Donna, CMA; 02/20/2015 9:36 AM) No pertinent past medical history  Past Surgical History Marjean Donna, CMA; 02/20/2015 9:36 AM) Breast Biopsy Bilateral. Knee Surgery Right. Oral Surgery  Diagnostic Studies History Marjean Donna, CMA; 02/20/2015 9:36 AM) Colonoscopy within last year Mammogram within last year  Allergies Marjean Donna, CMA; 02/20/2015 9:37 AM) Milk Digestant *DIGESTIVE AIDS*  Medication History Davy Pique Bynum, CMA; 02/20/2015 9:37 AM) Tylenol Extra Strength (500MG  Tablet, Oral) Active. Black Cohosh (160MG  Capsule, Oral) Active. Ibuprofen (200MG  Tablet, Oral) Active. Probiotic Daily (Oral) Active. Turmeric (450MG  Capsule, Oral) Active. Medications Reconciled  Social History Marjean Donna, CMA; 02/20/2015 9:36 AM) Alcohol use Remotely quit alcohol use. Caffeine use Coffee. No drug use Tobacco use Never smoker.  Family History Marjean Donna, [place]; 02/20/2015 9:36 AM) Heart Disease Father. Hypertension Father, Mother.  Pregnancy / Birth History Marjean Donna, Sarah Ann; 02/20/2015 9:36 AM) Age at menarche 22 years. Age of menopause 51-55 Contraceptive History Oral contraceptives. Gravida 2 Maternal age 54-25 Para 1    Review of Systems ([place]; 02/20/2015 9:36 AM) General Not Present- Appetite Loss, Chills, Fatigue, Fever, Night Sweats, Weight Gain and Weight Loss. Skin Not Present- Change in Wart/Mole, Dryness, Hives,  Jaundice, New Lesions, Non-Healing Wounds, Rash and Ulcer. HEENT Not Present- Earache, Hearing Loss, Hoarseness, Nose Bleed, Oral Ulcers, Ringing in the Ears, Seasonal Allergies, Sinus Pain, Sore Throat, Visual Disturbances, Wears glasses/contact lenses and Yellow Eyes. Respiratory Not Present- Bloody sputum, Chronic Cough, Difficulty Breathing, Snoring and Wheezing. Breast Not Present- Breast Mass, Breast Pain, Nipple Discharge and Skin Changes. Cardiovascular Not Present- Chest Pain, Difficulty Breathing Lying Down, Leg Cramps, Palpitations, Rapid Heart Rate, Shortness of Breath and Swelling of Extremities. Gastrointestinal Not Present- Abdominal Pain, Bloating, Bloody Stool, Change in Bowel Habits, Chronic diarrhea, Constipation, Difficulty Swallowing, Excessive gas, Gets full quickly at meals, Hemorrhoids, Indigestion, Nausea, Rectal Pain and Vomiting. Female Genitourinary Not Present- Frequency, Nocturia, Painful Urination, Pelvic Pain and Urgency. Musculoskeletal Not Present- Back Pain, Joint Pain, Joint Stiffness, Muscle Pain, Muscle Weakness and Swelling of Extremities. Neurological Not Present- Decreased Memory, Fainting, Headaches, Numbness, Seizures, Tingling, Tremor, Trouble walking and Weakness. Psychiatric Not Present- Anxiety, Bipolar, Change in Sleep Pattern, Depression, Fearful and Frequent crying. Endocrine Not Present- Cold Intolerance, Excessive Hunger, Hair Changes, Heat Intolerance, Hot flashes and New Diabetes. Hematology Not Present- Easy Bruising, Excessive bleeding, Gland problems, HIV and Persistent Infections.  Vitals (Sonya Bynum CMA; 02/20/2015 9:37 AM) 02/20/2015 9:36 AM Weight: 137 lb Height: 65in Body Surface Area: 1.68 m Body Mass Index: 22.8 kg/m  Temp.: 98.76F(Temporal)  Pulse: 73 (Regular)  BP: 128/76 (Sitting, Left Arm, Standard)       Physical Exam Rolm Bookbinder MD; 02/20/2015 1:17 PM) General Mental  Status-Alert. Orientation-Oriented X3.  Chest and Lung Exam Chest and lung exam reveals -on auscultation, normal breath sounds, no adventitious sounds and normal vocal resonance.  Breast Nipples-No Discharge. Breast Lump-No Palpable Breast Mass.  Cardiovascular Cardiovascular examination reveals -normal heart sounds, regular  rate and rhythm with no murmurs.  Lymphatic Head & Neck  General Head & Neck Lymphatics: Bilateral - Description - Normal. Axillary  General Axillary Region: Bilateral - Description - Normal. 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 discussed risks/benefits of both. small chance of upgrading this lesion with excision. she would like to undergo excision. risks, recovery and procedure discussed.

## 2015-02-25 NOTE — Anesthesia Procedure Notes (Signed)
Procedure Name: LMA Insertion Date/Time: 02/25/2015 11:16 AM Performed by: Lyndee Leo Pre-anesthesia Checklist: Patient identified, Emergency Drugs available, Suction available and Patient being monitored Patient Re-evaluated:Patient Re-evaluated prior to inductionOxygen Delivery Method: Circle System Utilized Preoxygenation: Pre-oxygenation with 100% oxygen Intubation Type: IV induction Ventilation: Mask ventilation without difficulty LMA: LMA inserted LMA Size: 4.0 Number of attempts: 1 Airway Equipment and Method: Bite block Placement Confirmation: positive ETCO2 Tube secured with: Tape Dental Injury: Teeth and Oropharynx as per pre-operative assessment

## 2015-02-25 NOTE — Discharge Instructions (Signed)
[facility] °Office Phone Number [phone] ° °POST OP INSTRUCTIONS ° °Always review your discharge instruction sheet given to you by the facility where your surgery was performed. ° °IF YOU HAVE DISABILITY OR FAMILY LEAVE FORMS, YOU MUST BRING THEM TO THE OFFICE FOR PROCESSING.  DO NOT GIVE THEM TO YOUR DOCTOR. ° °1. A prescription for pain medication may be given to you upon discharge.  Take your pain medication as prescribed, if needed.  If narcotic pain medicine is not needed, then you may take acetaminophen (Tylenol), naprosyn (Alleve) or ibuprofen (Advil) as needed. °2. Take your usually prescribed medications unless otherwise directed °3. If you need a refill on your pain medication, please contact your pharmacy.  They will contact our office to request authorization.  Prescriptions will not be filled after 5pm or on week-ends. °4. You should eat very light the first 24 hours after surgery, such as soup, crackers, pudding, etc.  Resume your normal diet the day after surgery. °5. Most patients will experience some swelling and bruising in the breast.  Ice packs and a good support bra will help.  Wear the breast binder provided or a sports bra for 72 hours day and night.  After that wear a sports bra during the day until you return to the office. Swelling and bruising can take several days to resolve.  °6. It is common to experience some constipation if taking pain medication after surgery.  Increasing fluid intake and taking a stool softener will usually help or prevent this problem from occurring.  A mild laxative (Milk of Magnesia or Miralax) should be taken according to package directions if there are no bowel movements after 48 hours. °7. Unless discharge instructions indicate otherwise, you may remove your bandages 48 hours after surgery and you may shower at that time.  You may have steri-strips (small skin tapes) in place directly over the incision.  These strips should be left on the  skin for 7-10 days and will come off on their own.  If your surgeon used skin glue on the incision, you may shower in 24 hours.  The glue will flake off over the next 2-3 weeks.  Any sutures or staples will be removed at the office during your follow-up visit. °8. ACTIVITIES:  You may resume regular daily activities (gradually increasing) beginning the next day.  Wearing a good support bra or sports bra minimizes pain and swelling.  You may have sexual intercourse when it is comfortable. °a. You may drive when you no longer are taking prescription pain medication, you can comfortably wear a seatbelt, and you can safely maneuver your car and apply brakes. °b. RETURN TO WORK:  ______________________________________________________________________________________ °9. You should see your doctor in the office for a follow-up appointment approximately two weeks after your surgery.  Your doctor’s nurse will typically make your follow-up appointment when she calls you with your pathology report.  Expect your pathology report 3-4 business days after your surgery.  You may call to check if you do not hear from us after three days. °10. OTHER INSTRUCTIONS: _______________________________________________________________________________________________ _____________________________________________________________________________________________________________________________________ °_____________________________________________________________________________________________________________________________________ °_____________________________________________________________________________________________________________________________________ ° °WHEN TO CALL DR WAKEFIELD: °1. Fever over 101.0 °2. Nausea and/or vomiting. °3. Extreme swelling or bruising. °4. Continued bleeding from incision. °5. Increased pain, redness, or drainage from the incision. ° °The clinic staff is available to answer your questions during regular  business hours.  Please don’t hesitate to call and ask to speak to one of the nurses for clinical concerns.  If   you have a medical emergency, go to the nearest emergency room or call 911.  A surgeon from [facility] is always on call at the hospital. ° °For further questions, please visit [email or web address] ° ° ° °Post Anesthesia Home Care Instructions ° °Activity: °Get plenty of rest for the remainder of the day. A responsible adult should stay with you for 24 hours following the procedure.  °For the next 24 hours, DO NOT: °-Drive a car °-Operate machinery °-Drink alcoholic beverages °-Take any medication unless instructed by your physician °-Make any legal decisions or sign important papers. ° °Meals: °Start with liquid foods such as gelatin or soup. Progress to regular foods as tolerated. Avoid greasy, spicy, heavy foods. If nausea and/or vomiting occur, drink only clear liquids until the nausea and/or vomiting subsides. Call your physician if vomiting continues. ° °Special Instructions/Symptoms: °Your throat may feel dry or sore from the anesthesia or the breathing tube placed in your throat during surgery. If this causes discomfort, gargle with warm salt water. The discomfort should disappear within 24 hours. ° °If you had a scopolamine patch placed behind your ear for the management of post- operative nausea and/or vomiting: ° °1. The medication in the patch is effective for 72 hours, after which it should be removed.  Wrap patch in a tissue and discard in the trash. Wash hands thoroughly with soap and water. °2. You may remove the patch earlier than 72 hours if you experience unpleasant side effects which may include dry mouth, dizziness or visual disturbances. °3. Avoid touching the patch. Wash your hands with soap and water after contact with the patch. °  ° °

## 2015-02-26 ENCOUNTER — Encounter (HOSPITAL_BASED_OUTPATIENT_CLINIC_OR_DEPARTMENT_OTHER): Payer: Self-pay | Admitting: General Surgery

## 2015-03-04 ENCOUNTER — Telehealth: Payer: Self-pay | Admitting: Obstetrics & Gynecology

## 2015-03-04 NOTE — Telephone Encounter (Signed)
Patient wanting to discuss hormone therapy.

## 2015-03-04 NOTE — Telephone Encounter (Signed)
Routing to Dr. Sabra Heck to review.  Last visit notes discuss Osphena after breast evaluation completed. Patient completed lumpectomy 02/25/15. Patient wishes to start lowest dose medication as possible as she has discussed with Dr. Sabra Heck. She thinks that she and Dr. Sabra Heck discussed using a cream as well.  Advised patient will send message to Dr. Sabra Heck and return call with response.  Confirmed pharmacy, [facility] in [place].

## 2015-03-05 MED ORDER — ESTRADIOL 10 MCG VA TABS: ORAL_TABLET | VAGINAL | Status: DC

## 2015-03-05 NOTE — Telephone Encounter (Signed)
Lowest dosage and lowest risk would be vagifem (and it just went generic).  1 tab pv x 14 days and then twice weekly.  Ok to send in [place].  Recheck 3 months.  Will assess then if should use something stronger.  Thanks.

## 2015-03-05 NOTE — Telephone Encounter (Signed)
Call to patient and she is given message from Dr. Sabra Heck. She is given instructions and verbalized understanding. She will call back to schedule follow up in three months.  Order placed for Vagifem per Dr. Sabra Heck.

## 2015-05-06 ENCOUNTER — Telehealth: Payer: Self-pay | Admitting: Obstetrics & Gynecology

## 2015-05-06 NOTE — Telephone Encounter (Signed)
Spoke with patient. Patient states she has been on estradiol vaginal tablets for almost three months. Asking if she needs to be seen for a recheck appointment. Per telephone encounter on 03/04/2015 Dr.Miller would like to see the patient for a recheck after she had been on the medication for 3 months. Recheck scheduled for 05/09/2015 at 10 am with Dr.Miller. She is agreeable to date and time.  Routing to provider for final review. Patient agreeable to disposition. Will close encounter.

## 2015-05-06 NOTE — Telephone Encounter (Signed)
Patient is not sure if she needs to schedule an appointment concerning her hormone replacement medication.

## 2015-05-08 ENCOUNTER — Telehealth: Payer: Self-pay | Admitting: Obstetrics & Gynecology

## 2015-05-08 NOTE — Telephone Encounter (Signed)
Patient declined to reschedule her appointment for a med recheck that was on 05/09/15. She said, "I am doing well on the medication and just need some refills on my Yuvafem."  Pharmacy on file is correct.

## 2015-05-09 ENCOUNTER — Ambulatory Visit: Payer: BC Managed Care – PPO | Admitting: Obstetrics & Gynecology

## 2015-05-13 MED ORDER — ESTRADIOL 10 MCG VA TABS: 1.0000 | ORAL_TABLET | VAGINAL | Status: DC

## 2015-05-13 NOTE — Telephone Encounter (Signed)
Order done for RF's for the year.  Rx sent to pharmacy electronically.  Please let pt know.  Thanks.

## 2015-05-14 NOTE — Telephone Encounter (Signed)
Pharmacist Dominique from [facility] called to clarify Vagifem rx. Gave her verbal instructions: 1 tab pv x 14 days and then twice weekly. Okay for 90 day supply if insurance allows. Patient notified that rx has been sent in by [facility].  Routed to provider for review, encounter closed.

## 2015-06-30 ENCOUNTER — Telehealth: Payer: Self-pay | Admitting: *Deleted

## 2015-06-30 NOTE — Telephone Encounter (Signed)
Patient was due for left breast DX MMG. She completed this 01/2015 and went on to have BX in 02/2015. Please advise recall- thanks Margaretha Sheffield

## 2015-07-01 NOTE — Telephone Encounter (Signed)
Out of MMG recall.  Thanks.

## 2016-05-06 ENCOUNTER — Encounter: Payer: Self-pay | Admitting: Obstetrics & Gynecology

## 2016-05-06 ENCOUNTER — Ambulatory Visit (INDEPENDENT_AMBULATORY_CARE_PROVIDER_SITE_OTHER): Payer: BC Managed Care – PPO | Admitting: Obstetrics & Gynecology

## 2016-05-06 ENCOUNTER — Other Ambulatory Visit (HOSPITAL_COMMUNITY)
Admission: RE | Admit: 2016-05-06 | Discharge: 2016-05-06 | Disposition: A | Discharge status: Home / Self Care | Payer: BC Managed Care – PPO | Source: Ambulatory Visit | Attending: Obstetrics & Gynecology | Admitting: Obstetrics & Gynecology

## 2016-05-06 VITALS — BP 100/60 | HR 94 | Resp 16 | Ht 64.75 in | Wt 129.0 lb

## 2016-05-06 DIAGNOSIS — Z124 Encounter for screening for malignant neoplasm of cervix: Secondary | ICD-10-CM | POA: Insufficient documentation

## 2016-05-06 DIAGNOSIS — Z0000 Encounter for general adult medical examination without abnormal findings: Secondary | ICD-10-CM | POA: Insufficient documentation

## 2016-05-06 DIAGNOSIS — Z205 Contact with and (suspected) exposure to viral hepatitis: Secondary | ICD-10-CM

## 2016-05-06 DIAGNOSIS — Z01419 Encounter for gynecological examination (general) (routine) without abnormal findings: Secondary | ICD-10-CM

## 2016-05-06 LAB — LIPID PANEL
CHOLESTEROL: 181 mg/dL (ref <200)
HDL: 80 mg/dL (ref >50)
LDL Cholesterol: 81 mg/dL (ref <100)
Total CHOL/HDL Ratio: 2.3 Ratio (ref <5.0)
Triglycerides: 102 mg/dL (ref <150)
VLDL: 20 mg/dL (ref <30)

## 2016-05-06 LAB — CBC
HEMATOCRIT: 41.2 % (ref 35.0–45.0)
HEMOGLOBIN: 13.7 g/dL (ref 11.7–15.5)
MCH: 30.5 pg (ref 27.0–33.0)
MCHC: 33.3 g/dL (ref 32.0–36.0)
MCV: 91.8 fL (ref 80.0–100.0)
MPV: 10.7 fL (ref 7.5–12.5)
Platelets: 214 10*3/uL (ref 140–400)
RBC: 4.49 MIL/uL (ref 3.80–5.10)
RDW: 13 % (ref 11.0–15.0)
WBC: 5.8 10*3/uL (ref 3.8–10.8)

## 2016-05-06 LAB — COMPREHENSIVE METABOLIC PANEL
ALK PHOS: 46 U/L (ref 33–130)
ALT: 11 U/L (ref 6–29)
AST: 20 U/L (ref 10–35)
Albumin: 4.9 g/dL (ref 3.6–5.1)
BUN: 13 mg/dL (ref 7–25)
CO2: 28 mmol/L (ref 20–31)
CREATININE: 0.89 mg/dL (ref 0.50–1.05)
Calcium: 10.2 mg/dL (ref 8.6–10.4)
Chloride: 103 mmol/L (ref 98–110)
Glucose, Bld: 79 mg/dL (ref 65–99)
Potassium: 4.2 mmol/L (ref 3.5–5.3)
Sodium: 141 mmol/L (ref 135–146)
TOTAL PROTEIN: 7.6 g/dL (ref 6.1–8.1)
Total Bilirubin: 0.6 mg/dL (ref 0.2–1.2)

## 2016-05-06 LAB — TSH: TSH: 1.3 mIU/L

## 2016-05-06 NOTE — Patient Instructions (Addendum)
Josph Macho Touch, Dian Queen, [facility]

## 2016-05-06 NOTE — Progress Notes (Signed)
56 y.o. G2P1 MarriedCaucasianF here for annual exam.  Doing well.  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  Keeping busy.  Stopped estradiol and black cohosh this past year.  Had a radial scar excised.  Having pain with intercourse.    PCP:  Dr. Gerarda Fraction.  Hasn't been seen in several years.  No LMP recorded. Patient has had an ablation.          Sexually active: Yes.    The current method of family planning is ablation.    Exercising: Yes.    Walking Smoker:  no  Health Maintenance: Pap:  12/26/13 Neg.   09/29/11 Neg, HR HPV: neg History of abnormal Pap:  Yes, CIN I cryosurgery  MMG:  02/25/15 left Breast Lumpectomy. Pathology: Radial Scar with Calcifications.   07/22/14 Bilateral Screening Right Breast Normal Colonoscopy:  03/09/13 Normal -- repeat 10 years BMD:   Never TDaP:  09/2011 Pneumonia vaccine(s):  No Zostavax:   No Hep C testing: Not done  Screening Labs: Not today    reports that she has never smoked. She has never used smokeless tobacco. She reports that she does not drink alcohol or use drugs.  Past Medical History:  Diagnosis Date  . Abnormal Pap smear   . Arthritis    shoulders  . Basal cell carcinoma   . Headache   . IBS (irritable bowel syndrome)   . Scoliosis   . Torn rotator cuff    right shoulder-had cortisone injections    Past Surgical History:  Procedure Laterality Date  . BREAST BIOPSY     right  . BREAST LUMPECTOMY WITH RADIOACTIVE SEED LOCALIZATION Left 02/25/2015   Procedure: LEFT BREAST LUMPECTOMY WITH RADIOACTIVE SEED LOCALIZATION;  Surgeon: Rolm Bookbinder, MD;  Location: [place];  Service: General;  Laterality: Left;  . CERVICAL BIOPSY    . GYNECOLOGIC CRYOSURGERY     CIN I  . HTA     polyp resection  . KNEE CARTILAGE SURGERY     right knee  . MOHS SURGERY    . RECTAL SURGERY     posterior/rectal sphincter repair    Current Outpatient Prescriptions  Medication Sig Dispense Refill  . Acetaminophen  (TYLENOL PO) Take by mouth as needed.    . fluticasone (FLONASE) 50 MCG/ACT nasal spray Place 1 spray into both nostrils daily.    Marland Kitchen ibuprofen (ADVIL,MOTRIN) 200 MG tablet Take 200 mg by mouth every 6 (six) hours as needed.    . Probiotic Product (PROBIOTIC DAILY PO) Take by mouth.    . TURMERIC PO Take by mouth daily.     No current facility-administered medications for this visit.     Family History  Problem Relation Age of Onset  . Breast cancer      maternal aunt, 2nd cousin  . Coronary artery disease Father 39  . Colon cancer Paternal Grandmother   . Esophageal cancer Neg Hx   . Stomach cancer Neg Hx   . Rectal cancer Neg Hx     ROS:  Pertinent items are noted in HPI.  Otherwise, a comprehensive ROS was negative.  Exam:   BP 100/60 (BP Location: Right Arm, Patient Position: Sitting, Cuff Size: Large)   Pulse 94   Resp 16   Ht 5' 4.75" (1.645 m)   Wt 129 lb (58.5 kg)   BMI 21.63 kg/m     Height: 5' 4.75" (164.5 cm)  Ht Readings from Last 3 Encounters:  05/06/16 5'  4.75" (1.645 m)  02/25/15 5' 5.5" (1.664 m)  02/06/15 5' 5.5" (1.664 m)    General appearance: alert, cooperative and appears stated age Head: Normocephalic, without obvious abnormality, atraumatic Neck: no adenopathy, supple, symmetrical, trachea midline and thyroid normal to inspection and palpation Lungs: clear to auscultation bilaterally Breasts: normal appearance, no masses or tenderness Heart: regular rate and rhythm Abdomen: soft, non-tender; bowel sounds normal; no masses,  no organomegaly Extremities: extremities normal, atraumatic, no cyanosis or edema Skin: Skin color, texture, turgor normal. No rashes or lesions Lymph nodes: Cervical, supraclavicular, and axillary nodes normal. No abnormal inguinal nodes palpated Neurologic: Grossly normal   Pelvic: External genitalia:  no lesions              Urethra:  normal appearing urethra with no masses, tenderness or lesions              Bartholins  and Skenes: normal                 Vagina: normal appearing vagina with normal color and discharge, no lesions              Cervix: no lesions              Pap taken: Yes.   Bimanual Exam:  Uterus:  normal size, contour, position, consistency, mobility, non-tender              Adnexa: normal adnexa and no mass, fullness, tenderness               Rectovaginal: Confirms               Anus:  normal sphincter tone, no lesions  Chaperone was present for exam.  A:  Well Woman with normal exam PMP, no HRT H/O lumpectomy for radial scar H/o fecal incontinence that has improved with bulking agents Family hx of breast cancer in maternal aunt and second cousing Dyspareunia with vaginal atrophic changes  P:   Mammogram guidelines reviewed.  Pt aware overdue.  Will call to schedule pap smear with HR HPV obtained today CBC, CMP, Lipids, TSH, Vit D, and Hep C antibody obtained today D/W pt topical treatments for dyspareunia including  return annually or prn

## 2016-05-07 ENCOUNTER — Other Ambulatory Visit: Payer: Self-pay | Admitting: Obstetrics & Gynecology

## 2016-05-07 DIAGNOSIS — Z1231 Encounter for screening mammogram for malignant neoplasm of breast: Secondary | ICD-10-CM

## 2016-05-07 LAB — HEPATITIS C ANTIBODY: HCV AB: NEGATIVE

## 2016-05-07 LAB — VITAMIN D 25 HYDROXY (VIT D DEFICIENCY, FRACTURES): Vit D, 25-Hydroxy: 40 ng/mL (ref 30–100)

## 2016-05-07 LAB — CYTOLOGY - PAP
Diagnosis: NEGATIVE
HPV (WINDOPATH): NOT DETECTED

## 2016-05-10 ENCOUNTER — Telehealth: Payer: Self-pay | Admitting: *Deleted

## 2016-05-10 NOTE — Telephone Encounter (Signed)
-----   Message from Megan Salon, MD sent at 05/07/2016  5:34 PM EDT ----- 02 recall.  I

## 2016-05-10 NOTE — Telephone Encounter (Signed)
Notes recorded by Megan Salon, MD on 05/07/2016 at 1:24 PM EDT Please let pt know her CBC, CMP, Lipids, TSH, and Vit D were all normal. Also, her Hep C was negative.  LM for pt to call back.

## 2016-05-11 NOTE — Telephone Encounter (Signed)
Patient advised of all lab results. Patient verbalized understanding and agreement.

## 2016-05-20 ENCOUNTER — Ambulatory Visit
Admission: RE | Admit: 2016-05-20 | Discharge: 2016-05-20 | Disposition: A | Discharge status: Home / Self Care | Payer: BC Managed Care – PPO | Source: Ambulatory Visit | Attending: Obstetrics & Gynecology | Admitting: Obstetrics & Gynecology

## 2016-05-20 ENCOUNTER — Ambulatory Visit: Payer: BC Managed Care – PPO | Admitting: Obstetrics & Gynecology

## 2016-05-20 DIAGNOSIS — Z1231 Encounter for screening mammogram for malignant neoplasm of breast: Secondary | ICD-10-CM

## 2016-05-28 ENCOUNTER — Ambulatory Visit: Payer: BC Managed Care – PPO

## 2016-07-27 IMAGING — MG MM BREAST BX W LOC DEV 1ST LESION IMAGE BX SPEC STEREO GUIDE*L*
3 series · 3 of 15 positions shown · non-contrast
Comparison: Previous exams.

ADDENDUM:
Pathology revealed fibrocystic changes with focal radial sclerosing
lesion in the left breast. This was found to be concordant by Dr.
Danling Ilahi. Pathology was discussed with the patient by
telephone with tenderness at the site. The patient reported doing
well after the biopsy. Post biopsy instructions and care were
reviewed and questions were answered. The patient was encouraged to
call The [REDACTED] for any additional
concerns. Surgical consultation has been arranged with Dr. Carela
Pierre at [REDACTED] on February 20, 2015.

Pathology results reported by Demetris Mauer RN, BSN on 02/06/2015.
CLINICAL DATA: 54-year-old female presenting for stereotactic
biopsy of left breast distortion.
EXAM:
LEFT BREAST STEREOTACTIC CORE NEEDLE BIOPSY

[L CC tomo (1 of 3) · tomo slice 25/48.0]
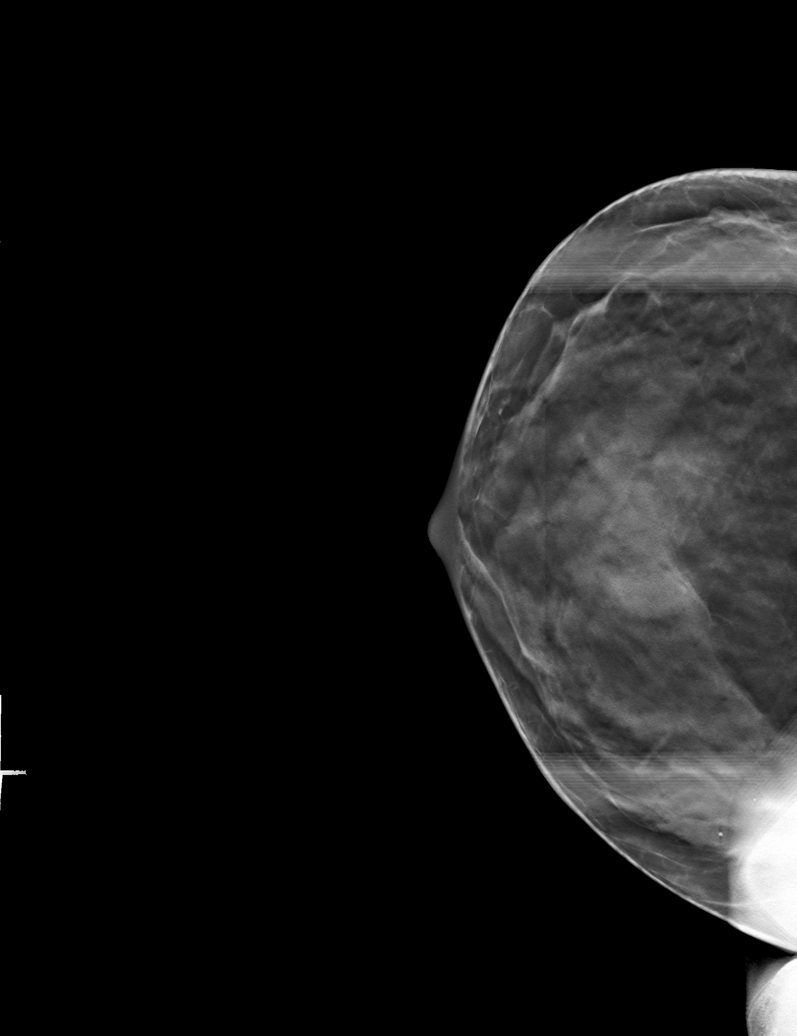

[L CC tomo (2 of 3) · tomo slice 24/47.0]
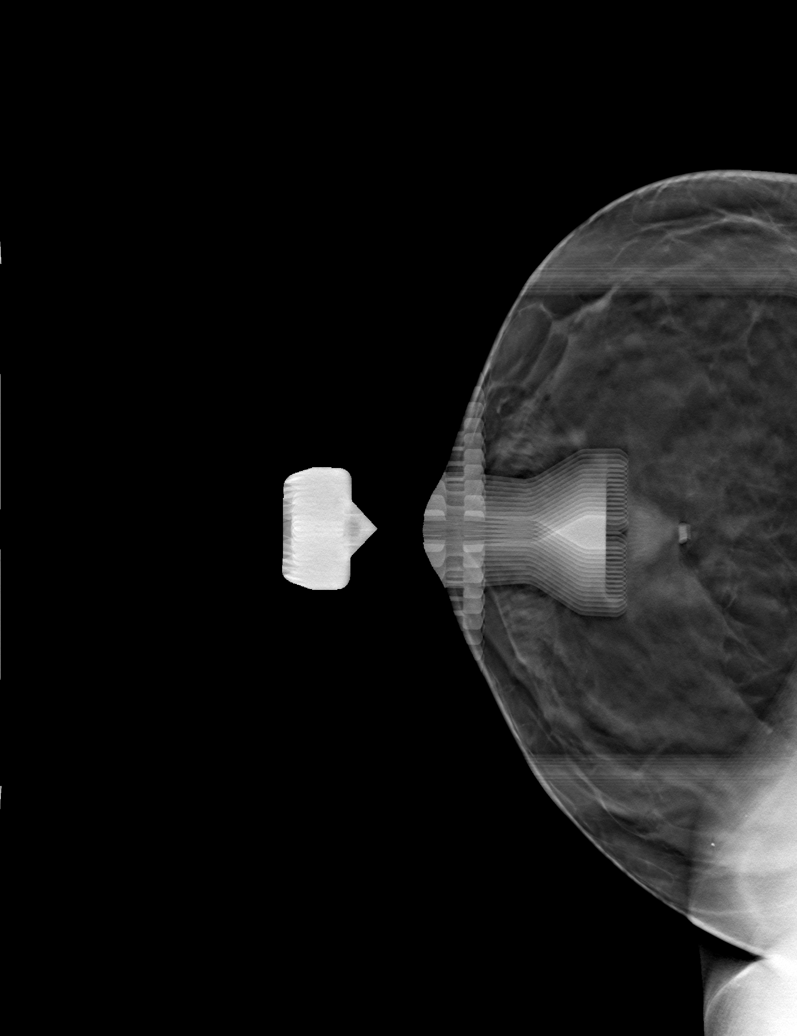

[L CC tomo (3 of 3) · tomo slice 24/47.0]
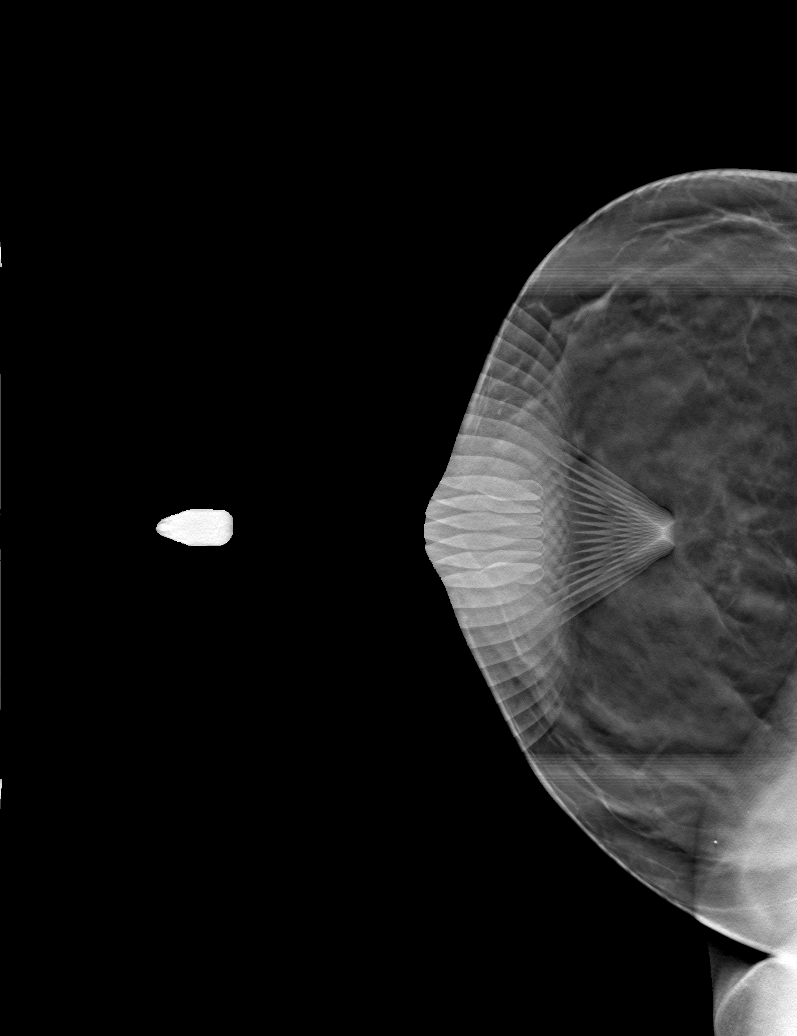

[3 of 15 positions shown; findings below may reference images not displayed]



Using sterile technique and 1% Lidocaine as local anesthetic, under
stereotactic guidance, a 9 gauge vacuum assisted device was used to
perform core needle biopsy of distortion the superior left breast 12
o'clock using a superior approach.

At the conclusion of the procedure, a coil shaped tissue marker clip
was deployed into the biopsy cavity. Follow-up 2-view mammogram was
performed and dictated separately.
IMPRESSION: Stereotactic-guided biopsy of left breast distortion 12 o'clock. No
apparent complications.

## 2016-07-27 IMAGING — MG MM DIAG BREAST TOMO UNI LEFT
4 series · 4 of 12 positions shown · non-contrast
Comparison: Previous exam(s).

CLINICAL DATA: Post biopsy mammogram of the left breast for clip
placement.

EXAM:
3D DIAGNOSTIC LEFT MAMMOGRAM POST STEREOTACTIC BIOPSY

[L CC]
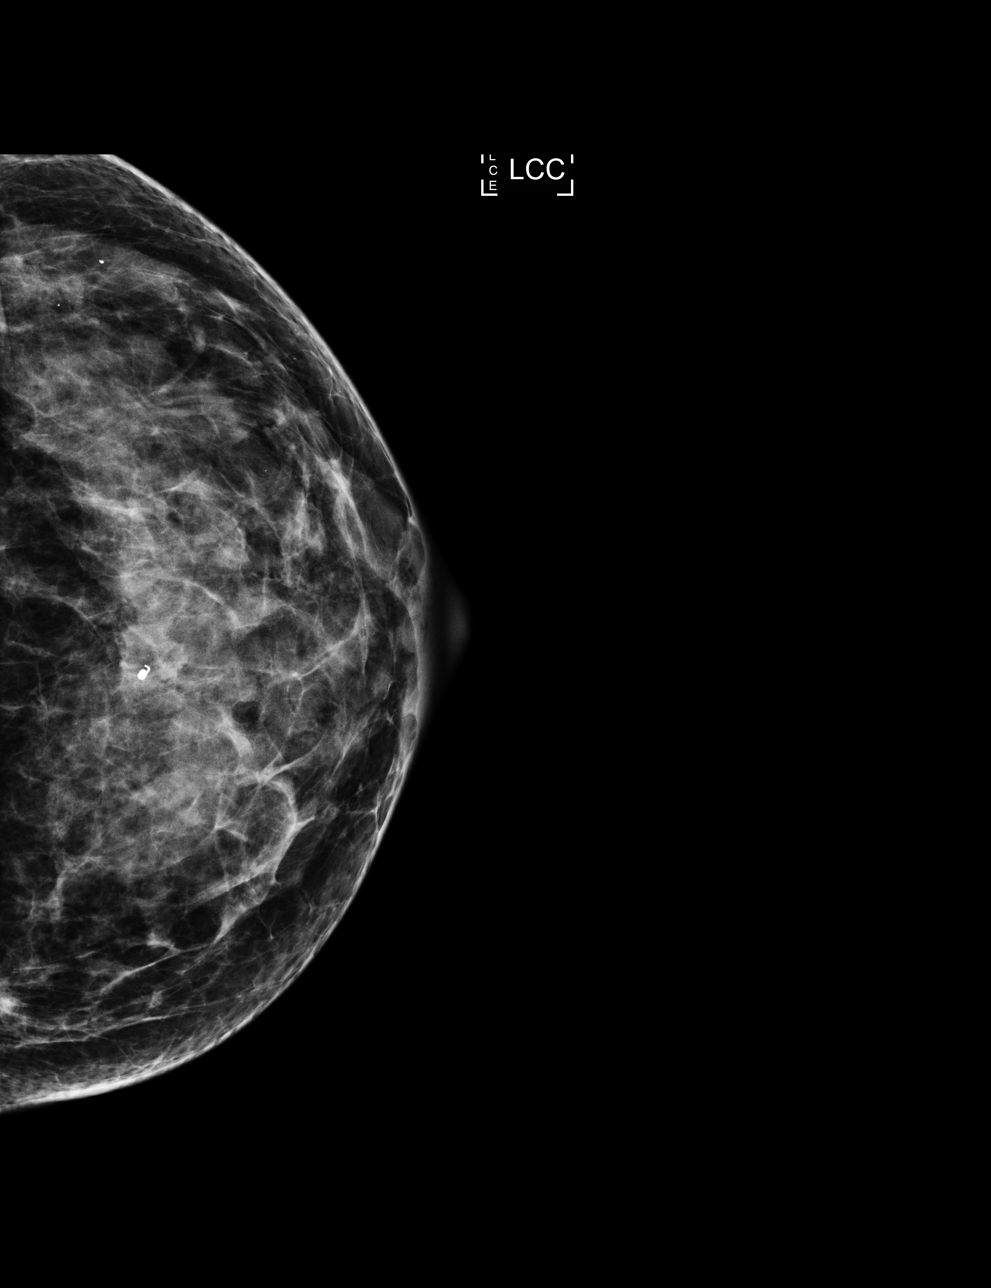

[L ML]
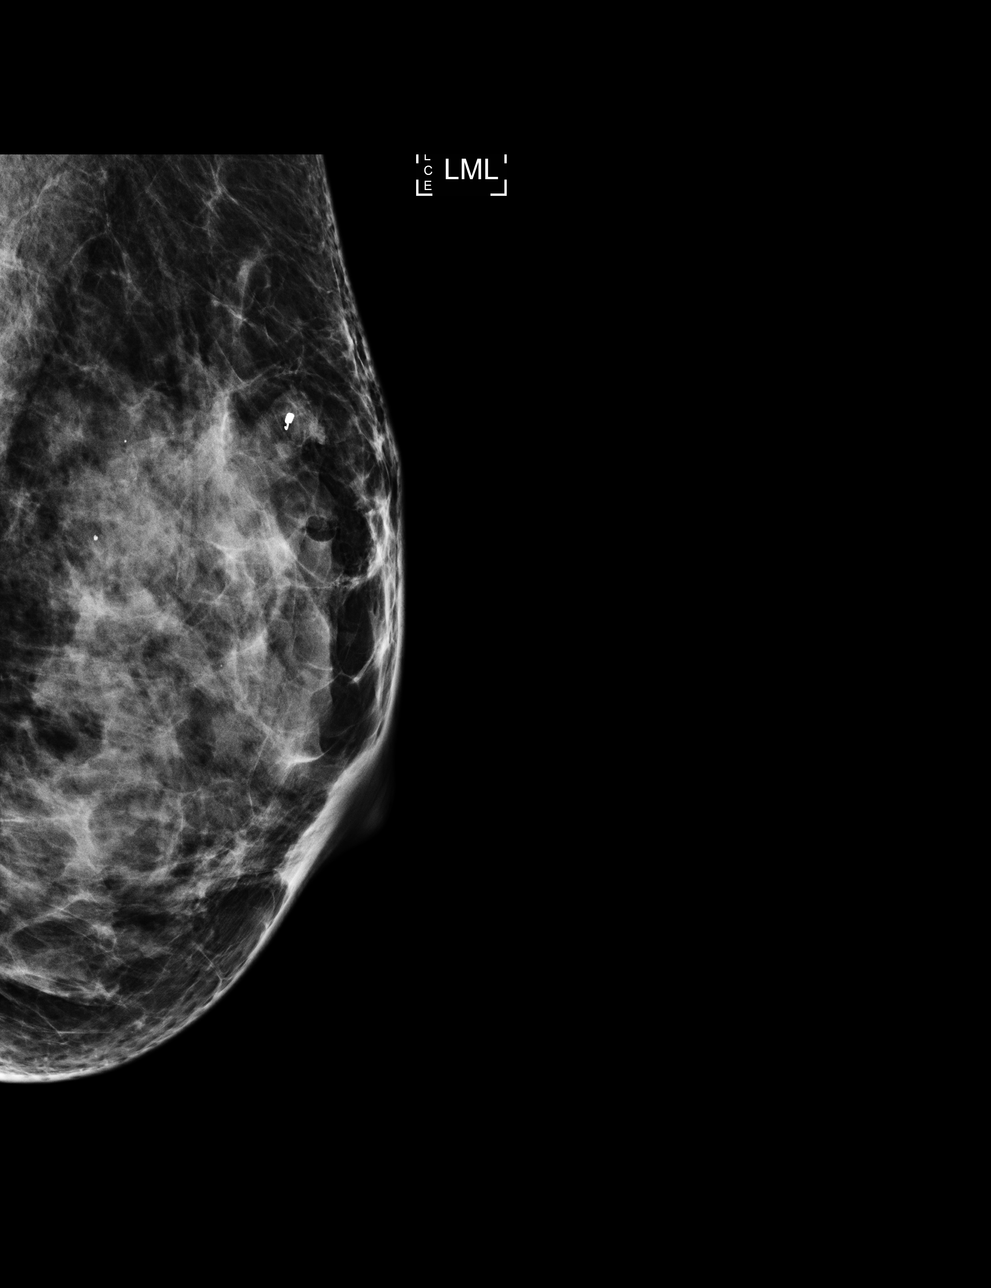

[L ML tomo · tomo slice 31/61.0]
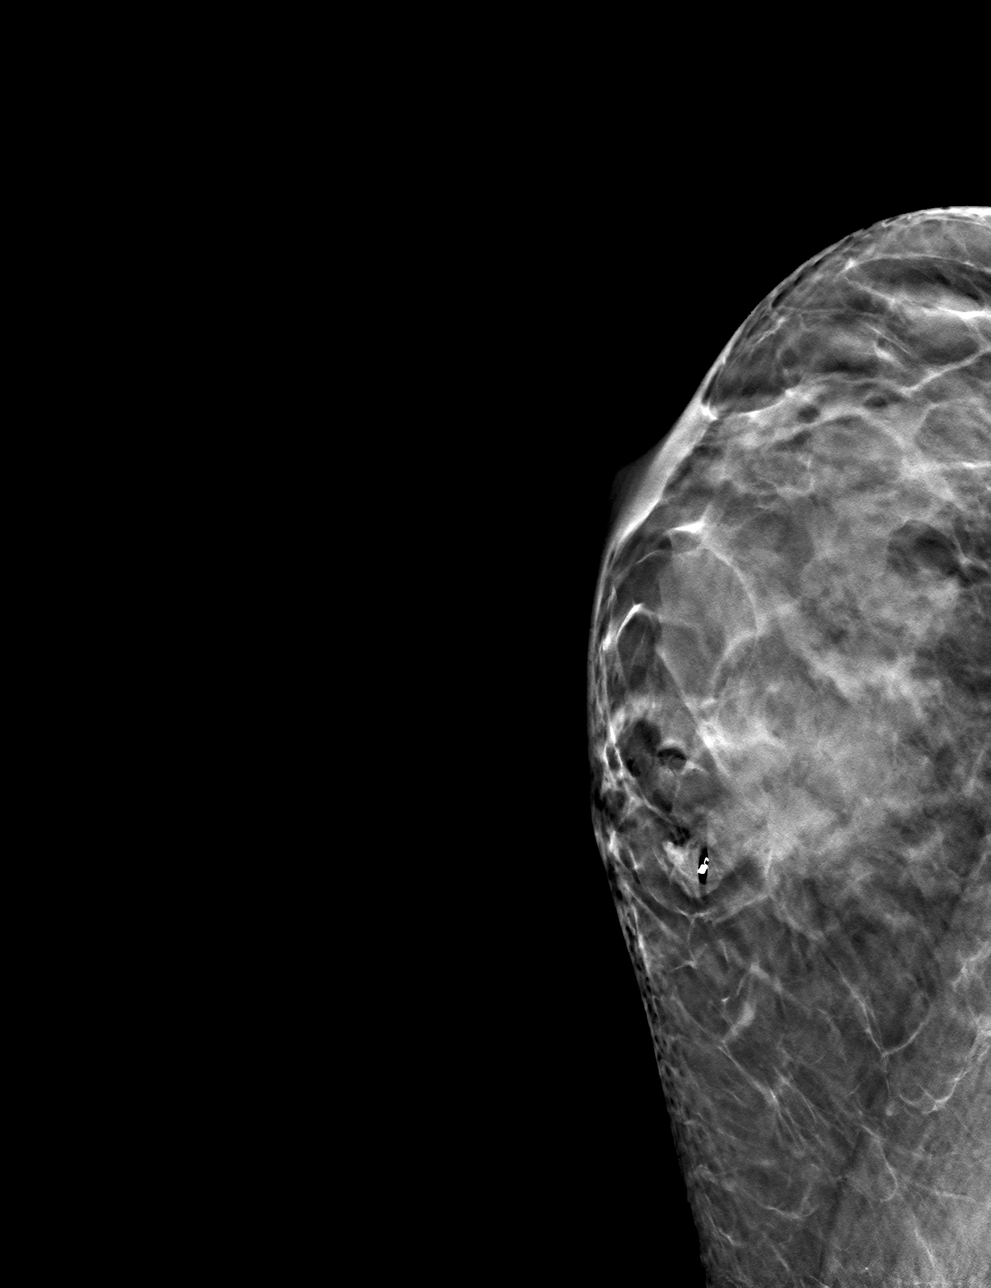

[L CC tomo · tomo slice 31/61.0]
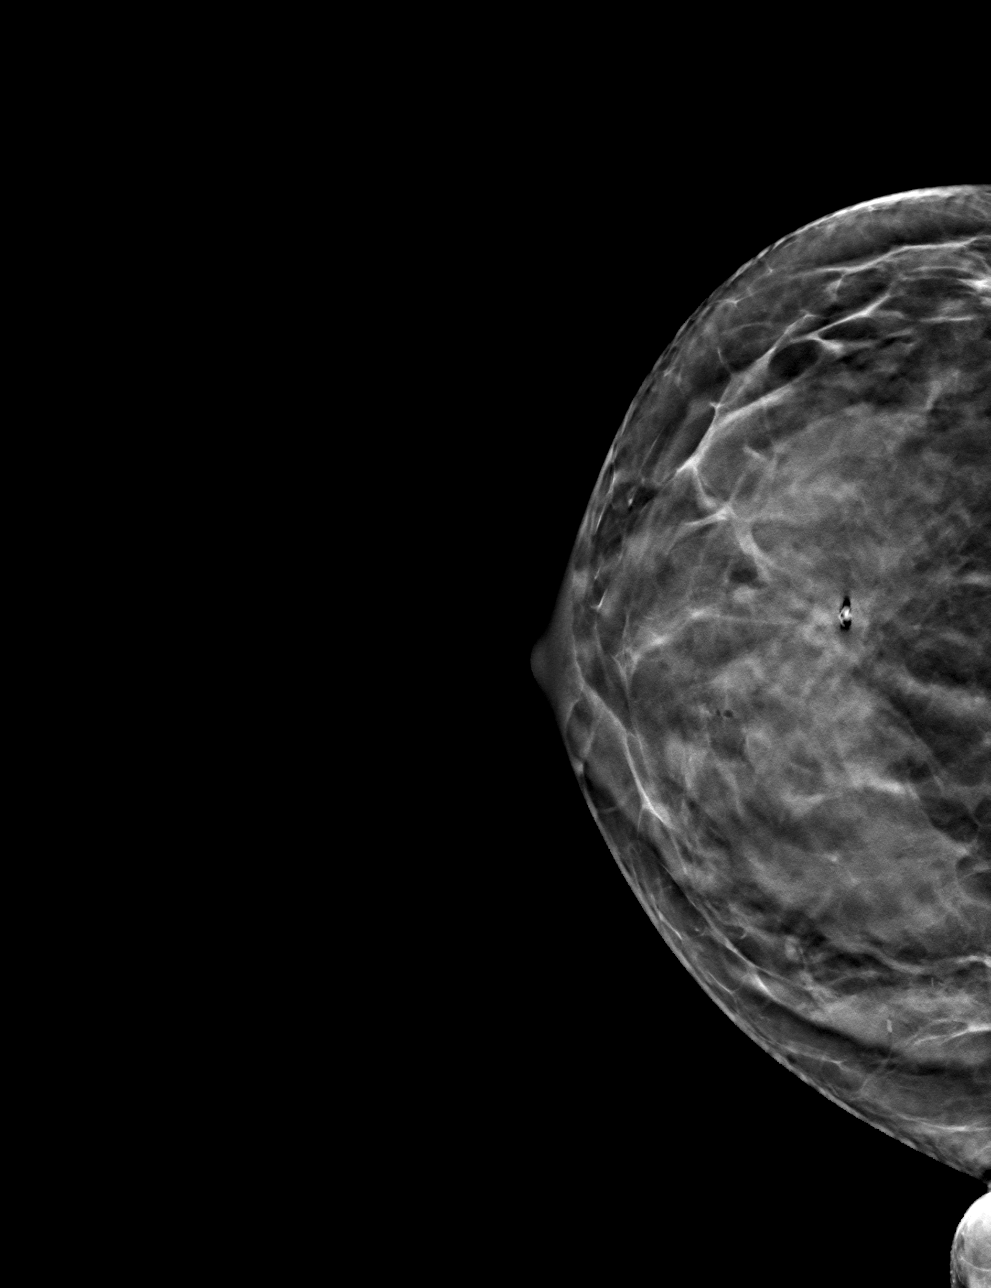

[4 of 12 positions shown; findings below may reference images not displayed]

FINDINGS: 3D Mammographic images were obtained following stereotactic guided
biopsy of distortion in the left breast at 12 o'clock. The coil
shaped biopsy marking clip is approximately 1.3 cm superiorly
displaced from the biopsied site of distortion.
IMPRESSION: The coil shaped biopsy marking clip is approximately 1.3 cm
superiorly displaced from the biopsied site of distortion at 12
o'clock in the left breast.

Final Assessment: Post Procedure Mammograms for Marker Placement

## 2017-04-11 ENCOUNTER — Other Ambulatory Visit: Payer: Self-pay | Admitting: Obstetrics & Gynecology

## 2017-04-11 DIAGNOSIS — Z1231 Encounter for screening mammogram for malignant neoplasm of breast: Secondary | ICD-10-CM

## 2017-05-23 ENCOUNTER — Ambulatory Visit
Admission: RE | Admit: 2017-05-23 | Discharge: 2017-05-23 | Disposition: A | Discharge status: Home / Self Care | Payer: BC Managed Care – PPO | Source: Ambulatory Visit | Attending: Obstetrics & Gynecology | Admitting: Obstetrics & Gynecology

## 2017-05-23 DIAGNOSIS — Z1231 Encounter for screening mammogram for malignant neoplasm of breast: Secondary | ICD-10-CM

## 2017-07-11 ENCOUNTER — Ambulatory Visit (INDEPENDENT_AMBULATORY_CARE_PROVIDER_SITE_OTHER): Payer: BC Managed Care – PPO | Admitting: Obstetrics & Gynecology

## 2017-07-11 ENCOUNTER — Encounter: Payer: Self-pay | Admitting: Obstetrics & Gynecology

## 2017-07-11 VITALS — BP 102/70 | HR 80 | Resp 16 | Ht 64.75 in | Wt 134.2 lb

## 2017-07-11 DIAGNOSIS — Z01419 Encounter for gynecological examination (general) (routine) without abnormal findings: Secondary | ICD-10-CM | POA: Diagnosis not present

## 2017-07-11 NOTE — Patient Instructions (Addendum)
Key-E Vit E vaginal suppositories.  One vaginally two or three times weekly.

## 2017-07-11 NOTE — Progress Notes (Signed)
57 y.o. G2P1 MarriedCaucasianF here for annual exam.  Doing well.  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  Went to the [place].  Had a new 56 month old grandson.  This is #5.    Denies vaginal bleeding.  Having a lot of vaginal pain so not very sexually active.    PCP:  Dr. Gerarda Fraction  No LMP recorded. Patient has had an ablation.          Sexually active: Yes.    The current method of family planning is ablation.    Exercising: Yes.    walking Smoker:  no  Health Maintenance: Pap:  05/06/16 Neg. HR HPV:neg   History of abnormal Pap:  Yes, CIN I MMG:  05/23/17 BIRADS1:Neg Colonoscopy:  03/09/13 Normal, Dr. Olevia Perches.  Follow-up 10 years. BMD:   never TDaP:  2013 Pneumonia vaccine(s):  n/a Shingrix:   Declined Shingrix today Hep C testing: 05/06/16 neg  Screening Labs: here today    reports that she has never smoked. She has never used smokeless tobacco. She reports that she does not drink alcohol or use drugs.  Past Medical History:  Diagnosis Date  . Abnormal Pap smear   . Arthritis    shoulders  . Basal cell carcinoma   . Headache   . IBS (irritable bowel syndrome)   . Scoliosis   . Torn rotator cuff    right shoulder-had cortisone injections    Past Surgical History:  Procedure Laterality Date  . BREAST BIOPSY Left   . BREAST EXCISIONAL BIOPSY Left 02/25/2015   excision of radial scar with calcs  . BREAST LUMPECTOMY WITH RADIOACTIVE SEED LOCALIZATION Left 02/25/2015   Procedure: LEFT BREAST LUMPECTOMY WITH RADIOACTIVE SEED LOCALIZATION;  Surgeon: Rolm Bookbinder, MD;  Location: [place];  Service: General;  Laterality: Left;  . CERVICAL BIOPSY    . GYNECOLOGIC CRYOSURGERY     CIN I  . HTA     polyp resection  . KNEE CARTILAGE SURGERY     right knee  . MOHS SURGERY    . RECTAL SURGERY     posterior/rectal sphincter repair    Current Outpatient Medications  Medication Sig Dispense Refill  . Acetaminophen  (TYLENOL PO) Take by mouth as needed.    . fluticasone (FLONASE) 50 MCG/ACT nasal spray Place 1 spray into both nostrils daily.    Marland Kitchen ibuprofen (ADVIL,MOTRIN) 200 MG tablet Take 200 mg by mouth every 6 (six) hours as needed.    . Probiotic Product (PROBIOTIC DAILY PO) Take by mouth.    . TURMERIC PO Take by mouth daily.     No current facility-administered medications for this visit.     Family History  Problem Relation Age of Onset  . Breast cancer Unknown        maternal aunt, 2nd cousin  . Coronary artery disease Father 42  . Colon cancer Paternal Grandmother   . Esophageal cancer Neg Hx   . Stomach cancer Neg Hx   . Rectal cancer Neg Hx     Review of Systems  Genitourinary:       Loss of sexual interest  Pain with intercourse   All other systems reviewed and are negative.   Exam:   BP 102/70 (BP Location: Right Arm, Patient Position: Sitting, Cuff Size: Normal)   Pulse 80   Resp 16   Ht 5' 4.75" (1.645 m)   Wt 134 lb 3.2 oz (60.9 kg)  BMI 22.50 kg/m     Height: 5' 4.75" (164.5 cm)  Ht Readings from Last 3 Encounters:  07/11/17 5' 4.75" (1.645 m)  05/06/16 5' 4.75" (1.645 m)  02/25/15 5' 5.5" (1.664 m)    General appearance: alert, cooperative and appears stated age Head: Normocephalic, without obvious abnormality, atraumatic Neck: no adenopathy, supple, symmetrical, trachea midline and thyroid normal to inspection and palpation Lungs: clear to auscultation bilaterally Breasts: normal appearance, no masses or tenderness Heart: regular rate and rhythm Abdomen: soft, non-tender; bowel sounds normal; no masses,  no organomegaly Extremities: extremities normal, atraumatic, no cyanosis or edema Skin: Skin color, texture, turgor normal. No rashes or lesions Lymph nodes: Cervical, supraclavicular, and axillary nodes normal. No abnormal inguinal nodes palpated Neurologic: Grossly normal   Pelvic: External genitalia:  no lesions              Urethra:  normal  appearing urethra with no masses, tenderness or lesions              Bartholins and Skenes: normal                 Vagina: normal appearing vagina with normal color and discharge, no lesions              Cervix: no lesions              Pap taken: No. Bimanual Exam:  Uterus:  normal size, contour, position, consistency, mobility, non-tender              Adnexa: normal adnexa and no mass, fullness, tenderness               Rectovaginal: Confirms               Anus:  normal sphincter tone, no lesions  Chaperone was present for exam.  A:  Well Woman with normal exam PMP, no HRT Dyspareunia H/o lumpectomy due radial scar H/O fecal incontinence, improved with bulking agent Family hx of breast cancer in maternal aunt and second counsin  P:   Mammogram guidelines reviewed.   Pap with neg HR HPV obtained 2018.  Not indicated today Lab work done 2018.  Colonoscopy is UTD  Recommended trial of Vit E vaginal suppositories.  She will order these and does not need Rx. Return annually or prn

## 2018-01-29 ENCOUNTER — Emergency Department (HOSPITAL_COMMUNITY): Payer: BC Managed Care – PPO

## 2018-01-29 ENCOUNTER — Encounter (HOSPITAL_COMMUNITY): Payer: Self-pay | Admitting: Emergency Medicine

## 2018-01-29 ENCOUNTER — Emergency Department (HOSPITAL_COMMUNITY)
Admission: EM | Admit: 2018-01-29 | Discharge: 2018-01-29 | Disposition: A | Discharge status: Home / Self Care | Payer: BC Managed Care – PPO | Attending: Emergency Medicine | Admitting: Emergency Medicine

## 2018-01-29 ENCOUNTER — Other Ambulatory Visit: Payer: Self-pay

## 2018-01-29 DIAGNOSIS — W1842XA Slipping, tripping and stumbling without falling due to stepping into hole or opening, initial encounter: Secondary | ICD-10-CM | POA: Insufficient documentation

## 2018-01-29 DIAGNOSIS — S52502A Unspecified fracture of the lower end of left radius, initial encounter for closed fracture: Secondary | ICD-10-CM | POA: Diagnosis not present

## 2018-01-29 DIAGNOSIS — S52615A Nondisplaced fracture of left ulna styloid process, initial encounter for closed fracture: Secondary | ICD-10-CM | POA: Insufficient documentation

## 2018-01-29 DIAGNOSIS — Y929 Unspecified place or not applicable: Secondary | ICD-10-CM | POA: Diagnosis not present

## 2018-01-29 DIAGNOSIS — Y999 Unspecified external cause status: Secondary | ICD-10-CM | POA: Diagnosis not present

## 2018-01-29 DIAGNOSIS — W19XXXA Unspecified fall, initial encounter: Secondary | ICD-10-CM

## 2018-01-29 DIAGNOSIS — S59912A Unspecified injury of left forearm, initial encounter: Secondary | ICD-10-CM | POA: Diagnosis present

## 2018-01-29 DIAGNOSIS — Z79899 Other long term (current) drug therapy: Secondary | ICD-10-CM | POA: Insufficient documentation

## 2018-01-29 DIAGNOSIS — Y9301 Activity, walking, marching and hiking: Secondary | ICD-10-CM | POA: Insufficient documentation

## 2018-01-29 NOTE — ED Triage Notes (Signed)
Walking in woods fell into hole and tried to catch fall with L wrist

## 2018-01-29 NOTE — ED Provider Notes (Signed)
[facility] EMERGENCY DEPARTMENT Provider Note   CSN: [id number] Arrival date & time: 01/29/18  1643     History   Chief Complaint Chief Complaint  Patient presents with  . Fall  . Wrist Pain    HPI Kathryn Burton is a 58 y.o. female presenting for evaluation after fall.  Patient states she was walking the woods when she accidentally stepped in a hole and fell, reaching with her left hand to catch herself.  She reports feeling a popping sensation in her left wrist/forearm.  She reports acute onset pain.  This occurred approximately an hour and half prior to arrival.  She reports minimal pain at rest, significant pain with movement.  She denies numbness or tingling.  She reports swelling has increased since the event.  She denies pain in her elbow.  She did not hit her head or lose consciousness.  She took 800 mg of ibuprofen prior to arrival, has not taken anything else for pain.  She has no other medical problems, takes no medications daily.  She denies history of wrist problems.  She is right-handed.  HPI  Past Medical History:  Diagnosis Date  . Abnormal Pap smear   . Arthritis    shoulders  . Basal cell carcinoma   . Headache   . IBS (irritable bowel syndrome)   . Scoliosis   . Torn rotator cuff    right shoulder-had cortisone injections    Patient Active Problem List   Diagnosis Date Noted  . Radial scar of breast 02/20/2015  . Vaginal atrophy 02/09/2015  . Dyspareunia in female 02/09/2015    Past Surgical History:  Procedure Laterality Date  . BREAST BIOPSY Left   . BREAST EXCISIONAL BIOPSY Left 02/25/2015   excision of radial scar with calcs  . BREAST LUMPECTOMY WITH RADIOACTIVE SEED LOCALIZATION Left 02/25/2015   Procedure: LEFT BREAST LUMPECTOMY WITH RADIOACTIVE SEED LOCALIZATION;  Surgeon: Rolm Bookbinder, MD;  Location: [place];  Service: General;  Laterality: Left;  . CERVICAL BIOPSY    . GYNECOLOGIC CRYOSURGERY     CIN I  . HTA      polyp resection  . KNEE CARTILAGE SURGERY     right knee  . MOHS SURGERY    . RECTAL SURGERY     posterior/rectal sphincter repair     OB History    Gravida  2   Para  1   Term  1   Preterm      AB  1   Living  1     SAB  1   TAB      Ectopic      Multiple      Live Births               Home Medications    Prior to Admission medications   Medication Sig Start Date End Date Taking? Authorizing Provider  Acetaminophen (TYLENOL PO) Take by mouth as needed.    [provider]  fluticasone (FLONASE) 50 MCG/ACT nasal spray Place 1 spray into both nostrils daily.    [provider]  ibuprofen (ADVIL,MOTRIN) 200 MG tablet Take 200 mg by mouth every 6 (six) hours as needed.    [provider]  Probiotic Product (PROBIOTIC DAILY PO) Take by mouth.    [provider]  TURMERIC PO Take by mouth daily.    [provider]    Family History Family History  Problem Relation Age of Onset  .  Breast cancer Other        maternal aunt, 2nd cousin  . Coronary artery disease Father 12  . Colon cancer Paternal Grandmother   . Esophageal cancer Neg Hx   . Stomach cancer Neg Hx   . Rectal cancer Neg Hx     Social History Social History   Tobacco Use  . Smoking status: Never Smoker  . Smokeless tobacco: Never Used  Substance Use Topics  . Alcohol use: No  . Drug use: No     Allergies   Patient has no known allergies.   Review of Systems Review of Systems  Musculoskeletal: Positive for arthralgias.  Neurological: Negative for numbness.  All other systems reviewed and are negative.    Physical Exam Updated Vital Signs BP (!) 113/58 (BP Location: Left Arm)   Pulse 92   Temp 98.2 F (36.8 C) (Oral)   Resp 18   Ht 5\' 6"  (1.676 m)   Wt 59 kg   SpO2 100%   BMI 20.98 kg/m   Physical Exam Vitals signs and nursing note reviewed.  Constitutional:      General: She is not in acute distress.    Appearance:  She is well-developed.     Comments: Appears uncomfortable due to pain, but in no acute distress  HENT:     Head: Normocephalic and atraumatic.     Comments: No obvious sign of head injury Eyes:     Extraocular Movements: Extraocular movements intact.     Pupils: Pupils are equal, round, and reactive to light.  Neck:     Musculoskeletal: Normal range of motion.  Cardiovascular:     Rate and Rhythm: Normal rate and regular rhythm.     Pulses: Normal pulses.  Pulmonary:     Effort: Pulmonary effort is normal.     Breath sounds: No rhonchi or rales.  Chest:     Chest wall: No tenderness.  Abdominal:     General: There is no distension.     Palpations: Abdomen is soft.     Tenderness: There is no abdominal tenderness.  Musculoskeletal:        General: Swelling, tenderness and deformity present.     Comments: Filling of the left wrist over the distal ulna and radius.  Tenderness palpation.  Limited range of motion due to pain.  Radial pulse intact.  Good cap refill of distal fingers.  No tenderness palpation of hand or anatomic snuffbox.  Sensation intact of distal fingers.  No tenderness palpation of proximal forearm or elbow.  Skin:    General: Skin is warm.     Capillary Refill: Capillary refill takes less than 2 seconds.     Findings: No rash.  Neurological:     Mental Status: She is alert and oriented to person, place, and time.      ED Treatments / Results  Labs (all labs ordered are listed, but only abnormal results are displayed) Labs Reviewed - No data to display  EKG None  Radiology Dg Wrist Complete Left  Result Date: 01/29/2018 CLINICAL DATA:  Fall on outstretched hand with left wrist pain. EXAM: LEFT WRIST - COMPLETE 3+ VIEW COMPARISON:  None. FINDINGS: There is a transverse minimally displaced fracture of the distal radius with minimal impaction and dorsal displacement of the distal fracture fragment. There is an associated ulnar styloid process avulsion  fracture. There is soft tissue swelling about the wrist. Carpal rows are preserved. IMPRESSION: Transverse minimally displaced and impacted fracture of the  distal radius, with accompanied ulnar styloid process avulsion fracture. Soft tissue swelling. Electronically Signed   By: Fidela Salisbury M.D.   On: 01/29/2018 17:42    Procedures .Splint Application Date/Time: 2/40/9735 6:47 PM Performed by: Corrie Mckusick, NT Authorized by: Franchot Heidelberg, PA-C   Consent:    Consent obtained:  Verbal   Consent given by:  Patient   Risks discussed:  Discoloration, numbness, pain and swelling Pre-procedure details:    Sensation:  Normal   Skin color:  P, w, d Procedure details:    Laterality:  Left   Location:  Wrist   Wrist:  L wrist   Strapping: yes     Splint type:  Sugar tong Post-procedure details:    Pain:  Unchanged   Sensation:  Normal   Skin color:  P, w, d   Patient tolerance of procedure:  Tolerated well, no immediate complications   (including critical care time)  Medications Ordered in ED Medications - No data to display   Initial Impression / Assessment and Plan / ED Course  I have reviewed the triage vital signs and the nursing notes.  Pertinent labs & imaging results that were available during my care of the patient were reviewed by me and considered in my medical decision making (see chart for details).     Pt presenting for evaluation of wrist pain after mechanical fall.  Physical exam reassuring, she is neurovascularly intact.  However, obvious deformity of the left wrist, concern for radial fracture.  Will obtain x-rays and reassess.  Patient does not want narcotic pain medicine at this time.  X-rays viewed interpreted by me, shows distal ulnar styloid fracture and distal radial fracture.  Per radiology read, minimally displaced dorsally with impaction.  Patient is requesting to follow-up with Dr. Vanetta Shawl office.  Will consult for recommendations on  splinting and follow-up.  Discussed with Dr. Tonita Cong from [facility], recommend sugar tong and to call the office tomorrow for follow-up.  At this time, patient appears safe for discharge.  Return precautions given.  Patient that she understands agrees plan.    Final Clinical Impressions(s) / ED Diagnoses   Final diagnoses:  Closed fracture of distal end of left radius, unspecified fracture morphology, initial encounter  Closed nondisplaced fracture of styloid process of left ulna, initial encounter  Fall, initial encounter    ED Discharge Orders    None       Franchot Heidelberg, PA-C 01/29/18 Precious Reel, MD 01/29/18 [id number]

## 2018-01-29 NOTE — Discharge Instructions (Addendum)
X-rays today showed fracture of the end of both the radius and ulna. Call Dr. Vanetta Shawl office tomorrow to set up follow-up appointment. Keep the splint on until you follow-up with Dr. Amedeo Plenty. Take ibuprofen 3 times a day with meals.  Take 800 mg( 4 over-the-counter pills) at a time.  Do not take other anti-inflammatories at the same time (Advil, Motrin, naproxen, Aleve). You may supplement with Tylenol if you need further pain control. Use ice packs for top of the splint, 20 minutes at a time, 3-4 times a day. Keep your arm elevated or above heart level when able. Return to the emergency room if you develop numbness, severe worsening pain, or any new, worsening, or concerning symptoms.

## 2018-01-30 ENCOUNTER — Encounter (HOSPITAL_COMMUNITY): Payer: Self-pay | Admitting: *Deleted

## 2018-01-31 ENCOUNTER — Encounter (HOSPITAL_COMMUNITY)
Admission: RE | Disposition: A | Discharge status: Home / Self Care | Payer: Self-pay | Source: Home / Self Care | Attending: Orthopedic Surgery

## 2018-01-31 ENCOUNTER — Encounter (HOSPITAL_COMMUNITY): Payer: Self-pay | Admitting: *Deleted

## 2018-01-31 ENCOUNTER — Ambulatory Visit (HOSPITAL_COMMUNITY): Payer: BC Managed Care – PPO | Admitting: Anesthesiology

## 2018-01-31 ENCOUNTER — Ambulatory Visit (HOSPITAL_COMMUNITY)
Admission: RE | Admit: 2018-01-31 | Discharge: 2018-01-31 | Disposition: A | Discharge status: Home / Self Care | Payer: BC Managed Care – PPO | Attending: Orthopedic Surgery | Admitting: Orthopedic Surgery

## 2018-01-31 DIAGNOSIS — S52552A Other extraarticular fracture of lower end of left radius, initial encounter for closed fracture: Secondary | ICD-10-CM | POA: Insufficient documentation

## 2018-01-31 DIAGNOSIS — S52502A Unspecified fracture of the lower end of left radius, initial encounter for closed fracture: Secondary | ICD-10-CM | POA: Diagnosis present

## 2018-01-31 DIAGNOSIS — W19XXXA Unspecified fall, initial encounter: Secondary | ICD-10-CM | POA: Diagnosis not present

## 2018-01-31 DIAGNOSIS — S52612A Displaced fracture of left ulna styloid process, initial encounter for closed fracture: Secondary | ICD-10-CM | POA: Insufficient documentation

## 2018-01-31 DIAGNOSIS — Z79899 Other long term (current) drug therapy: Secondary | ICD-10-CM | POA: Insufficient documentation

## 2018-01-31 HISTORY — PX: OPEN REDUCTION INTERNAL FIXATION (ORIF) DISTAL RADIAL FRACTURE: SHX5989

## 2018-01-31 LAB — HEMOGLOBIN: Hemoglobin: 12.1 g/dL (ref 12.0–15.0)

## 2018-01-31 SURGERY — OPEN REDUCTION INTERNAL FIXATION (ORIF) DISTAL RADIUS FRACTURE
Anesthesia: Regional | Site: Wrist | Laterality: Left

## 2018-01-31 MED ORDER — MIDAZOLAM HCL 2 MG/2ML IJ SOLN
2.0000 mg | Freq: Once | INTRAMUSCULAR | Status: AC
  Administered 2018-01-31: 2 mg via INTRAVENOUS

## 2018-01-31 MED ORDER — DEXAMETHASONE SODIUM PHOSPHATE 10 MG/ML IJ SOLN
INTRAMUSCULAR | Status: DC | PRN
  Administered 2018-01-31: 10 mg via INTRAVENOUS

## 2018-01-31 MED ORDER — PROPOFOL 10 MG/ML IV BOLUS
INTRAVENOUS | Status: DC | PRN
  Administered 2018-01-31: 20 mg via INTRAVENOUS

## 2018-01-31 MED ORDER — MIDAZOLAM HCL 2 MG/2ML IJ SOLN
INTRAMUSCULAR | Status: AC
  Administered 2018-01-31: 2 mg via INTRAVENOUS
  Filled 2018-01-31: qty 2

## 2018-01-31 MED ORDER — BUPIVACAINE HCL (PF) 0.25 % IJ SOLN
INTRAMUSCULAR | Status: AC
  Filled 2018-01-31: qty 30

## 2018-01-31 MED ORDER — PROPOFOL 10 MG/ML IV BOLUS
INTRAVENOUS | Status: AC
  Filled 2018-01-31: qty 20

## 2018-01-31 MED ORDER — 0.9 % SODIUM CHLORIDE (POUR BTL) OPTIME
TOPICAL | Status: DC | PRN
  Administered 2018-01-31: 1000 mL

## 2018-01-31 MED ORDER — CHLORHEXIDINE GLUCONATE 4 % EX LIQD: 60.0000 mL | Freq: Once | CUTANEOUS | Status: DC

## 2018-01-31 MED ORDER — FENTANYL CITRATE (PF) 250 MCG/5ML IJ SOLN
INTRAMUSCULAR | Status: AC
  Filled 2018-01-31: qty 5

## 2018-01-31 MED ORDER — PROPOFOL 500 MG/50ML IV EMUL
INTRAVENOUS | Status: DC | PRN
  Administered 2018-01-31: 50 ug/kg/min via INTRAVENOUS

## 2018-01-31 MED ORDER — CEFAZOLIN SODIUM-DEXTROSE 2-4 GM/100ML-% IV SOLN
2.0000 g | INTRAVENOUS | Status: AC
  Administered 2018-01-31: 2 g via INTRAVENOUS

## 2018-01-31 MED ORDER — ROPIVACAINE HCL 5 MG/ML IJ SOLN
INTRAMUSCULAR | Status: DC | PRN
  Administered 2018-01-31: 30 mL via PERINEURAL

## 2018-01-31 MED ORDER — FENTANYL CITRATE (PF) 100 MCG/2ML IJ SOLN
INTRAMUSCULAR | Status: AC
  Filled 2018-01-31: qty 2

## 2018-01-31 MED ORDER — MIDAZOLAM HCL 2 MG/2ML IJ SOLN
INTRAMUSCULAR | Status: AC
  Filled 2018-01-31: qty 2

## 2018-01-31 MED ORDER — LACTATED RINGERS IV SOLN
INTRAVENOUS | Status: DC
  Administered 2018-01-31: 15:00:00 via INTRAVENOUS

## 2018-01-31 MED ORDER — CEFAZOLIN SODIUM-DEXTROSE 2-4 GM/100ML-% IV SOLN
INTRAVENOUS | Status: AC
  Filled 2018-01-31: qty 100

## 2018-01-31 MED ORDER — ONDANSETRON HCL 4 MG/2ML IJ SOLN
INTRAMUSCULAR | Status: DC | PRN
  Administered 2018-01-31: 4 mg via INTRAVENOUS

## 2018-01-31 MED ORDER — LIDOCAINE 2% (20 MG/ML) 5 ML SYRINGE
INTRAMUSCULAR | Status: AC
  Filled 2018-01-31: qty 5

## 2018-01-31 MED ORDER — MIDAZOLAM HCL 5 MG/5ML IJ SOLN
INTRAMUSCULAR | Status: DC | PRN
  Administered 2018-01-31: 2 mg via INTRAVENOUS

## 2018-01-31 MED ORDER — LIDOCAINE HCL (CARDIAC) PF 100 MG/5ML IV SOSY
PREFILLED_SYRINGE | INTRAVENOUS | Status: DC | PRN
  Administered 2018-01-31: 100 mg via INTRAVENOUS

## 2018-01-31 MED ORDER — FENTANYL CITRATE (PF) 100 MCG/2ML IJ SOLN: 25.0000 ug | INTRAMUSCULAR | Status: DC | PRN

## 2018-01-31 SURGICAL SUPPLY — 54 items
BANDAGE ACE 3X5.8 VEL STRL LF (GAUZE/BANDAGES/DRESSINGS) ×9 IMPLANT
BANDAGE ACE 4X5 VEL STRL LF (GAUZE/BANDAGES/DRESSINGS) ×3 IMPLANT
BIT DRILL 2.2 SS TIBIAL (BIT) ×3 IMPLANT
BNDG ESMARK 4X9 LF (GAUZE/BANDAGES/DRESSINGS) ×3 IMPLANT
BNDG GAUZE ELAST 4 BULKY (GAUZE/BANDAGES/DRESSINGS) ×3 IMPLANT
CORDS BIPOLAR (ELECTRODE) ×3 IMPLANT
COVER SURGICAL LIGHT HANDLE (MISCELLANEOUS) ×3 IMPLANT
COVER WAND RF STERILE (DRAPES) ×3 IMPLANT
CUFF TOURNIQUET SINGLE 18IN (TOURNIQUET CUFF) ×3 IMPLANT
CUFF TOURNIQUET SINGLE 24IN (TOURNIQUET CUFF) IMPLANT
DECANTER SPIKE VIAL GLASS SM (MISCELLANEOUS) IMPLANT
DRAPE OEC MINIVIEW 54X84 (DRAPES) IMPLANT
DRAPE U-SHAPE 47X51 STRL (DRAPES) ×3 IMPLANT
DRSG ADAPTIC 3X8 NADH LF (GAUZE/BANDAGES/DRESSINGS) ×3 IMPLANT
GAUZE SPONGE 4X4 12PLY STRL (GAUZE/BANDAGES/DRESSINGS) ×3 IMPLANT
GAUZE XEROFORM 5X9 LF (GAUZE/BANDAGES/DRESSINGS) ×3 IMPLANT
GLOVE BIOGEL M 8.0 STRL (GLOVE) ×3 IMPLANT
GLOVE SS BIOGEL STRL SZ 8 (GLOVE) ×1 IMPLANT
GLOVE SUPERSENSE BIOGEL SZ 8 (GLOVE) ×2
GOWN STRL REUS W/ TWL LRG LVL3 (GOWN DISPOSABLE) ×3 IMPLANT
GOWN STRL REUS W/ TWL XL LVL3 (GOWN DISPOSABLE) ×3 IMPLANT
GOWN STRL REUS W/TWL LRG LVL3 (GOWN DISPOSABLE) ×6
GOWN STRL REUS W/TWL XL LVL3 (GOWN DISPOSABLE) ×6
KIT BASIN OR (CUSTOM PROCEDURE TRAY) ×3 IMPLANT
KIT TURNOVER KIT B (KITS) ×3 IMPLANT
MANIFOLD NEPTUNE II (INSTRUMENTS) ×3 IMPLANT
NEEDLE 22X1 1/2 (OR ONLY) (NEEDLE) IMPLANT
NS IRRIG 1000ML POUR BTL (IV SOLUTION) ×3 IMPLANT
PACK ORTHO EXTREMITY (CUSTOM PROCEDURE TRAY) ×3 IMPLANT
PAD ARMBOARD 7.5X6 YLW CONV (MISCELLANEOUS) ×3 IMPLANT
PAD CAST 4YDX4 CTTN HI CHSV (CAST SUPPLIES) ×2 IMPLANT
PADDING CAST COTTON 4X4 STRL (CAST SUPPLIES) ×4
PEG LOCKING SMOOTH 2.2X18 (Peg) ×9 IMPLANT
PEG LOCKING SMOOTH 2.2X20 (Screw) ×6 IMPLANT
PLATE NARROW DVR LEFT (Plate) ×3 IMPLANT
SCREW LOCK 14X2.7X 3 LD TPR (Screw) ×1 IMPLANT
SCREW LOCK 20X2.7X 3 LD TPR (Screw) ×1 IMPLANT
SCREW LOCKING 2.7X13MM (Screw) ×6 IMPLANT
SCREW LOCKING 2.7X14 (Screw) ×2 IMPLANT
SCREW LOCKING 2.7X15MM (Screw) ×3 IMPLANT
SCREW LOCKING 2.7X20MM (Screw) ×2 IMPLANT
SCRUB BETADINE 4OZ XXX (MISCELLANEOUS) ×3 IMPLANT
SOL PREP POV-IOD 4OZ 10% (MISCELLANEOUS) ×3 IMPLANT
SPONGE LAP 4X18 RFD (DISPOSABLE) IMPLANT
SUT PROLENE 4 0 PS 2 18 (SUTURE) ×6 IMPLANT
SUT VIC AB 3-0 FS2 27 (SUTURE) ×3 IMPLANT
SYR CONTROL 10ML LL (SYRINGE) IMPLANT
SYSTEM CHEST DRAIN TLS 7FR (DRAIN) IMPLANT
TOWEL OR 17X26 10 PK STRL BLUE (TOWEL DISPOSABLE) ×3 IMPLANT
TUBE CONNECTING 12'X1/4 (SUCTIONS) ×1
TUBE CONNECTING 12X1/4 (SUCTIONS) ×2 IMPLANT
TUBE EVACUATION TLS (MISCELLANEOUS) ×3 IMPLANT
UNDERPAD 30X30 (UNDERPADS AND DIAPERS) ×3 IMPLANT
WATER STERILE IRR 1000ML POUR (IV SOLUTION) ×3 IMPLANT

## 2018-01-31 NOTE — Anesthesia Procedure Notes (Signed)
Procedure Name: MAC Date/Time: 01/31/2018 5:35 PM Performed by: Lance Coon, CRNA Pre-anesthesia Checklist: Patient identified, Emergency Drugs available, Suction available, Patient being monitored and Timeout performed Patient Re-evaluated:Patient Re-evaluated prior to induction Oxygen Delivery Method: Simple face mask

## 2018-01-31 NOTE — Op Note (Signed)
NAME: AHMARI, GARTON MEDICAL RECORD [id number] ACCOUNT [id number] DATE OF BIRTH:April 02, 1960 FACILITY: [facility] LOCATION: [facility] PHYSICIAN:Moiz Ryant M. Esme Freund, MD  OPERATIVE REPORT  DATE OF PROCEDURE:  01/31/2018  PREOPERATIVE DIAGNOSIS:  Left comminuted complex greater than 3-part distal radius fracture with associated ulnar styloid fracture.  POSTOPERATIVE DIAGNOSIS:  Left comminuted complex greater than 3-part distal radius fracture with associated ulnar styloid fracture.  PROCEDURE: 1.  Open reduction internal fixation left distal radius fracture with narrow DVR plate from [employer]. 2.  A 4-view radiographic series performed examined and interpreted by myself. 3.  Closed treatment ulnar styloid fracture.  SURGEON:  Roseanne Kaufman, MD  ASSISTANT:  None.  COMPLICATIONS:  None.  ANESTHESIA:  Block with IV sedation.  TOURNIQUET TIME:  Less than an hour.  DRAINS:  None.  INDICATIONS:  A 58 year old female with the above-mentioned diagnosis.  Given the comminution metaphyseal shift that has already occurred, age, and other issues, we have elected to proceed with definitive stabilization to try and give her back the  radiographic parameters and prevent collapse.  DESCRIPTION OF PROCEDURE:  The patient was seen by myself and anesthesia and taken to the operative theater and underwent smooth induction of IV sedation.  Preoperative block was placed.  Hibiclens scrub was accomplished.  Following this, the patient  underwent a Betadine scrub and paint.  Once this was complete, the Betadine scrub and paint was allowed to dry.  Arm was elevated, tourniquet was insufflated to 250 mmHg.  Timeout was observed, and Ancef was given.  A volar radial incision was made.   Dissection was carried down.  Palmar structures were identified.  The FCR was incised palmarly and dorsally, retracted radially and the carpal canal contents were retracted ulnarly.  Pronator was incised.  The wrist was then  reduced.  Provisional  fixation held with orthopedic instrument and I then applied a DVR narrow with plate and screw fixation and nicely.  This achieved adequate height, inclination and volar tilt.  There were no complicating features.  Following this, the patient then  underwent a very careful and cautious approach to the upper extremity with irrigation followed by pronator closure.  Closed treatment of the ulna styloid was elected to the fact that she was stable and there was no excessive displacement on radiographic  fluoroscopy.  I performed AP, lateral and oblique x-rays.  The x-rays were performed, examined and interpreted by myself and looked to be excellent.  Radial height, inclination and volar tilt as well as intercarpal arrangement looked excellent.  She was irrigated once again.  The pronator was closed with Vicryl.  The skin edge was closed with Prolene with the tourniquet deflated and hemostasis secured.  She will be discharged home on Keflex, OxyIR, Zofran p.r.n. nausea and Robaxin.  We will see her back in the office in 12 to 14 days.  Standard DVR protocol will be adhered to.  Should any problems occur, I will be immediately available.  TN/NUANCE  D:01/31/2018 T:01/31/2018 JOB:[id number]

## 2018-01-31 NOTE — H&P (Signed)
Kathryn Burton is an 58 y.o. female.   Chief Complaint: L DRF HPI: Patient presents for evaluation and treatment of the of their upper extremity predicament. The patient denies neck, back, chest or  abdominal pain. The patient notes that they have no lower extremity problems. The patients primary complaint is noted. We are planning surgical care pathway for the upper extremity.  Past Medical History:  Diagnosis Date  . Abnormal Pap smear   . Basal cell carcinoma   . Headache   . IBS (irritable bowel syndrome)   . Scoliosis   . Torn rotator cuff    right shoulder-had cortisone injections    Past Surgical History:  Procedure Laterality Date  . BREAST BIOPSY Left   . BREAST EXCISIONAL BIOPSY Left 02/25/2015   excision of radial scar with calcs  . BREAST LUMPECTOMY WITH RADIOACTIVE SEED LOCALIZATION Left 02/25/2015   Procedure: LEFT BREAST LUMPECTOMY WITH RADIOACTIVE SEED LOCALIZATION;  Surgeon: Rolm Bookbinder, MD;  Location: [facility];  Service: General;  Laterality: Left;  . CERVICAL BIOPSY    . GYNECOLOGIC CRYOSURGERY     CIN I  . HTA     polyp resection  . KNEE CARTILAGE SURGERY     right knee  . MOHS SURGERY    . RECTAL SURGERY     posterior/rectal sphincter repair    Family History  Problem Relation Age of Onset  . Breast cancer Other        maternal aunt, 2nd cousin  . Coronary artery disease Father 4  . Colon cancer Paternal Grandmother   . Esophageal cancer Neg Hx   . Stomach cancer Neg Hx   . Rectal cancer Neg Hx    Social History:  reports that she has never smoked. She has never used smokeless tobacco. She reports that she does not drink alcohol or use drugs.  Allergies:  Allergies  Allergen Reactions  . Other     Had a reaction to dissolvable sutures in 1979 (when they first came out); did not have any trouble in 2017 with dissolvable sutures    Medications Prior to Admission  Medication Sig Dispense Refill  . acetaminophen  (TYLENOL) 500 MG tablet Take 500 mg by mouth every 6 (six) hours as needed.    . Calcium 600-200 MG-UNIT tablet Take 1 tablet by mouth daily.    Marland Kitchen ELDERBERRY PO Take 15 mLs by mouth daily.    . fluticasone (FLONASE) 50 MCG/ACT nasal spray Place 1 spray into both nostrils daily.    Marland Kitchen ibuprofen (ADVIL,MOTRIN) 200 MG tablet Take 800 mg by mouth every 8 (eight) hours as needed (for pain.).     Marland Kitchen Probiotic Product (PROBIOTIC DAILY PO) Take 1 capsule by mouth daily.     . TURMERIC PO Take 1 capsule by mouth daily with lunch. Turmeric Supreme Extra Strength      Results for orders placed or performed during the hospital encounter of 01/31/18 (from the past 48 hour(s))  Hemoglobin     Status: None   Collection Time: 01/31/18  2:54 PM  Result Value Ref Range   Hemoglobin 12.1 12.0 - 15.0 g/dL    Comment: Performed at [facility], [place] [address] [place],  [address]   Dg Wrist Complete Left  Result Date: 01/29/2018 CLINICAL DATA:  Fall on outstretched hand with left wrist pain. EXAM: LEFT WRIST - COMPLETE 3+ VIEW COMPARISON:  None. FINDINGS: There is a transverse minimally displaced fracture of the distal radius  with minimal impaction and dorsal displacement of the distal fracture fragment. There is an associated ulnar styloid process avulsion fracture. There is soft tissue swelling about the wrist. Carpal rows are preserved. IMPRESSION: Transverse minimally displaced and impacted fracture of the distal radius, with accompanied ulnar styloid process avulsion fracture. Soft tissue swelling. Electronically Signed   By: Fidela Salisbury M.D.   On: 01/29/2018 17:42    ROS  Blood pressure (!) 108/45, pulse 86, temperature 98.1 F (36.7 C), temperature source Oral, resp. rate 20, height 5\' 6"  (1.676 m), weight 59 kg, SpO2 98 %. Physical Exam  Left comminuted wrist fracture with displacement we will plan for ORIF.  She is neurovascular intact.  No signs of infection or dystrophy.   The  patient is alert and oriented in no acute distress. The patient complains of pain in the affected upper extremity.  The patient is noted to have a normal HEENT exam. Lung fields show equal chest expansion and no shortness of breath. Abdomen exam is nontender without distention. Lower extremity examination does not show any fracture dislocation or blood clot symptoms. Pelvis is stable and the neck and back are stable and nontender. Assessment/Plan We will plan for open reduction internal fixation left distal radius fracture and repair reconstruction is necessary.  We are planning surgery for your upper extremity. The risk and benefits of surgery to include risk of bleeding, infection, anesthesia,  damage to normal structures and failure of the surgery to accomplish its intended goals of relieving symptoms and restoring function have been discussed in detail. With this in mind we plan to proceed. I have specifically discussed with the patient the pre-and postoperative regime and the dos and don'ts and risk and benefits in great detail. Risk and benefits of surgery also include risk of dystrophy(CRPS), chronic nerve pain, failure of the healing process to go onto completion and other inherent risks of surgery The relavent the pathophysiology of the disease/injury process, as well as the alternatives for treatment and postoperative course of action has been discussed in great detail with the patient who desires to proceed.  We will do everything in our power to help you (the patient) restore function to the upper extremity. It is a pleasure to see this patient today.  Willa Frater III, MD 01/31/2018, 5:11 PM

## 2018-01-31 NOTE — Op Note (Signed)
See Kathryne Gin MD

## 2018-01-31 NOTE — Anesthesia Procedure Notes (Signed)
Anesthesia Regional Block: Supraclavicular block   Pre-Anesthetic Checklist: ,, timeout performed, Correct Patient, Correct Site, Correct Laterality, Correct Procedure, Correct Position, site marked, Risks and benefits discussed,  Surgical consent,  Pre-op evaluation,  At surgeon's request and post-op pain management  Laterality: Left  Prep: chloraprep       Needles:  Injection technique: Single-shot  Needle Type: Echogenic Stimulator Needle     Needle Length: 9cm  Needle Gauge: 21     Additional Needles:   Procedures:,,,, ultrasound used (permanent image in chart),,,,  Narrative:  Start time: 01/31/2018 5:10 PM End time: 01/31/2018 5:15 PM Injection made incrementally with aspirations every 5 mL.  Performed by: Personally  Anesthesiologist: Lidia Collum, MD  Additional Notes: Monitors applied. Injection made in 5cc increments. No resistance to injection. Good needle visualization. Patient tolerated procedure well.

## 2018-01-31 NOTE — Discharge Instructions (Signed)
Elevate move and massage your fingers  We recommend that you to take vitamin C 1000 mg a day to promote healing. We also recommend that if you require  pain medicine that you take a stool softener to prevent constipation as most pain medicines will have constipation side effects. We recommend either Peri-Colace or Senokot and recommend that you also consider adding MiraLAX as well to prevent the constipation affects from pain medicine if you are required to use them. These medicines are over the counter and may be purchased at a local pharmacy. A cup of yogurt and a probiotic can also be helpful during the recovery process as the medicines can disrupt your intestinal environment. Keep bandage clean and dry.  Call for any problems.  No smoking.  Criteria for driving a car: you should be off your pain medicine for 7-8 hours, able to drive one handed(confident), thinking clearly and feeling able in your judgement to drive. Continue elevation as it will decrease swelling.  If instructed by MD move your fingers within the confines of the bandage/splint.  Use ice if instructed by your MD. Call immediately for any sudden loss of feeling in your hand/arm or change in functional abilities of the extremity.

## 2018-01-31 NOTE — Transfer of Care (Signed)
Immediate Anesthesia Transfer of Care Note  Patient: Kathryn Burton  Procedure(s) Performed: OPEN REDUCTION INTERNAL FIXATION (ORIF) DISTAL RADIAL FRACTURE (Left Wrist)  Patient Location: PACU  Anesthesia Type:MAC combined with regional for post-op pain  Level of Consciousness: awake and patient cooperative  Airway & Oxygen Therapy: Patient Spontanous Breathing  Post-op Assessment: Report given to RN and Post -op Vital signs reviewed and stable  Post vital signs: Reviewed and stable  Last Vitals:  Vitals Value Taken Time  BP    Temp    Pulse 83 01/31/2018  6:34 PM  Resp 8 01/31/2018  6:34 PM  SpO2 100 % 01/31/2018  6:34 PM  Vitals shown include unvalidated device data.  Last Pain:  Vitals:   01/31/18 1834  TempSrc:   PainSc: (P) 0-No pain         Complications: No apparent anesthesia complications

## 2018-01-31 NOTE — Anesthesia Preprocedure Evaluation (Addendum)
Anesthesia Evaluation  Patient identified by MRN, date of birth, ID band Patient awake    Reviewed: Allergy & Precautions, NPO status , Patient's Chart, lab work & pertinent test results  Airway Mallampati: II  TM Distance: >3 FB Neck ROM: Full    Dental no notable dental hx. (+) Teeth Intact, Dental Advisory Given   Pulmonary neg pulmonary ROS,    Pulmonary exam normal breath sounds clear to auscultation       Cardiovascular negative cardio ROS Normal cardiovascular exam Rhythm:Regular Rate:Normal     Neuro/Psych negative neurological ROS  negative psych ROS   GI/Hepatic negative GI ROS, Neg liver ROS,   Endo/Other  negative endocrine ROS  Renal/GU negative Renal ROS  negative genitourinary   Musculoskeletal negative musculoskeletal ROS (+)   Abdominal   Peds  Hematology negative hematology ROS (+)   Anesthesia Other Findings Left distal radius fracture  Reproductive/Obstetrics                           Anesthesia Physical Anesthesia Plan  ASA: I  Anesthesia Plan: Regional and MAC   Post-op Pain Management:  Regional for Post-op pain   Induction: Intravenous  PONV Risk Score and Plan: 3 and Midazolam, Ondansetron and Dexamethasone  Airway Management Planned: Natural Airway and Simple Face Mask  Additional Equipment:   Intra-op Plan:   Post-operative Plan:   Informed Consent: I have reviewed the patients History and Physical, chart, labs and discussed the procedure including the risks, benefits and alternatives for the proposed anesthesia with the patient or authorized representative who has indicated his/her understanding and acceptance.     Dental advisory given  Plan Discussed with: CRNA  Anesthesia Plan Comments:       Anesthesia Quick Evaluation

## 2018-02-01 ENCOUNTER — Encounter (HOSPITAL_COMMUNITY): Payer: Self-pay | Admitting: Orthopedic Surgery

## 2018-02-02 NOTE — Anesthesia Postprocedure Evaluation (Signed)
Anesthesia Post Note  Patient: Kathryn Burton  Procedure(s) Performed: OPEN REDUCTION INTERNAL FIXATION (ORIF) DISTAL RADIAL FRACTURE (Left Wrist)     Patient location during evaluation: PACU Anesthesia Type: Regional and General Level of consciousness: awake and alert Pain management: pain level controlled Vital Signs Assessment: post-procedure vital signs reviewed and stable Respiratory status: spontaneous breathing, nonlabored ventilation, respiratory function stable and patient connected to nasal cannula oxygen Cardiovascular status: blood pressure returned to baseline and stable Postop Assessment: no apparent nausea or vomiting Anesthetic complications: no    Last Vitals:  Vitals:   01/31/18 1843 01/31/18 1901  BP: 111/75 116/76  Pulse: 79 81  Resp: 12 16  Temp: (!) 36.3 C (!) 36.3 C  SpO2: 100% 100%    Last Pain:  Vitals:   01/31/18 1843  TempSrc:   PainSc: 0-No pain                 Bobak Oguinn S

## 2018-07-03 ENCOUNTER — Other Ambulatory Visit: Payer: Self-pay | Admitting: Obstetrics & Gynecology

## 2018-07-03 DIAGNOSIS — Z1231 Encounter for screening mammogram for malignant neoplasm of breast: Secondary | ICD-10-CM

## 2018-08-16 ENCOUNTER — Ambulatory Visit
Admission: RE | Admit: 2018-08-16 | Discharge: 2018-08-16 | Disposition: A | Discharge status: Home / Self Care | Payer: BC Managed Care – PPO | Source: Ambulatory Visit | Attending: Obstetrics & Gynecology | Admitting: Obstetrics & Gynecology

## 2018-08-16 ENCOUNTER — Other Ambulatory Visit: Payer: Self-pay

## 2018-08-16 DIAGNOSIS — Z1231 Encounter for screening mammogram for malignant neoplasm of breast: Secondary | ICD-10-CM

## 2018-08-22 ENCOUNTER — Telehealth: Payer: Self-pay | Admitting: Obstetrics & Gynecology

## 2018-08-22 DIAGNOSIS — E2839 Other primary ovarian failure: Secondary | ICD-10-CM

## 2018-08-22 NOTE — Telephone Encounter (Signed)
Patient is calling to request an order for a BMD. Patient stated that a detailed voicemail can be left on her cell phone.

## 2018-08-22 NOTE — Telephone Encounter (Signed)
Call to patient. Patient requesting order for BMD so she can call and schedule. Patient states she would like to go to [facility].   Order pended for BMD for Dr. Sabra Heck to review and sign.

## 2018-08-23 NOTE — Telephone Encounter (Signed)
Order signed.  She can call and schedule.  Thanks.  Encounter closed.

## 2018-08-23 NOTE — Telephone Encounter (Signed)
Patient notified order has been sent.

## 2018-09-05 ENCOUNTER — Other Ambulatory Visit: Payer: Self-pay

## 2018-09-07 ENCOUNTER — Encounter: Payer: Self-pay | Admitting: Obstetrics & Gynecology

## 2018-09-07 ENCOUNTER — Ambulatory Visit: Payer: BC Managed Care – PPO | Admitting: Obstetrics & Gynecology

## 2018-09-07 ENCOUNTER — Other Ambulatory Visit: Payer: Self-pay

## 2018-09-07 VITALS — BP 128/70 | HR 88 | Temp 97.9°F | Ht 65.0 in | Wt 133.0 lb

## 2018-09-07 DIAGNOSIS — Z0000 Encounter for general adult medical examination without abnormal findings: Secondary | ICD-10-CM

## 2018-09-07 DIAGNOSIS — Z01419 Encounter for gynecological examination (general) (routine) without abnormal findings: Secondary | ICD-10-CM

## 2018-09-07 MED ORDER — LIDOCAINE 5 % EX OINT: 1.0000 "application " | TOPICAL_OINTMENT | Freq: Every day | CUTANEOUS | 0 refills | Status: DC | PRN

## 2018-09-07 NOTE — Progress Notes (Signed)
58 y.o. [id number] Married White or Caucasian female here for annual exam.  Broke ankle at the end of last summer.  Broke left wrist that required surgery.  She is scheduled for a bone density.  Husband was diagnosed with prostate cancer and is going to have surgery at [facility].  Husband's brother had exact same surgery last year.    Denies vaginal bleeding.    PCP:  Dr. Gerarda Fraction.    No LMP recorded. Patient has had an ablation.          Sexually active: Yes.    The current method of family planning is post menopausal status.    Exercising: Yes.    walking, hiking  Smoker:  no  Health Maintenance: Pap:  05/06/16 Neg. HR HPV:neg   12/26/13 Neg History of abnormal Pap:  Yes, CIN I MMG:  08/16/18 BIRADS2:benign  Colonoscopy:  03/09/13 f/u 10 years  BMD:   Has appt 10/27/18 TDaP:  2013 Pneumonia vaccine(s):  No Shingrix:   Declines at this time Hep C testing: 05/06/16 Neg  Screening Labs: Here today - fasting    reports that she has never smoked. She has never used smokeless tobacco. She reports that she does not drink alcohol or use drugs.  Past Medical History:  Diagnosis Date  . Abnormal Pap smear   . Basal cell carcinoma   . Distal radial fracture    due to fall   . Headache   . IBS (irritable bowel syndrome)   . Scoliosis   . Torn rotator cuff    right shoulder-had cortisone injections    Past Surgical History:  Procedure Laterality Date  . BREAST BIOPSY Left   . BREAST EXCISIONAL BIOPSY Left 02/25/2015   excision of radial scar with calcs  . BREAST LUMPECTOMY WITH RADIOACTIVE SEED LOCALIZATION Left 02/25/2015   Procedure: LEFT BREAST LUMPECTOMY WITH RADIOACTIVE SEED LOCALIZATION;  Surgeon: Rolm Bookbinder, MD;  Location: [place];  Service: General;  Laterality: Left;  . CERVICAL BIOPSY    . GYNECOLOGIC CRYOSURGERY     CIN I  . HTA     polyp resection  . KNEE CARTILAGE SURGERY     right knee  . MOHS SURGERY    . OPEN REDUCTION INTERNAL FIXATION (ORIF) DISTAL  RADIAL FRACTURE Left 01/31/2018   Procedure: OPEN REDUCTION INTERNAL FIXATION (ORIF) DISTAL RADIAL FRACTURE;  Surgeon: Roseanne Kaufman, MD;  Location: [facility];  Service: Orthopedics;  Laterality: Left;  . RECTAL SURGERY     posterior/rectal sphincter repair    Current Outpatient Medications  Medication Sig Dispense Refill  . acetaminophen (TYLENOL) 500 MG tablet Take 500 mg by mouth every 6 (six) hours as needed.    . Calcium 600-200 MG-UNIT tablet Take 1 tablet by mouth daily.    Marland Kitchen ELDERBERRY PO Take 15 mLs by mouth daily.    . fluticasone (FLONASE) 50 MCG/ACT nasal spray Place 1 spray into both nostrils daily.    Marland Kitchen ibuprofen (ADVIL,MOTRIN) 200 MG tablet Take 800 mg by mouth every 8 (eight) hours as needed (for pain.).     Marland Kitchen Probiotic Product (PROBIOTIC DAILY PO) Take 1 capsule by mouth daily.     . TURMERIC PO Take 1 capsule by mouth daily with lunch. Turmeric Supreme Extra Strength     No current facility-administered medications for this visit.     Family History  Problem Relation Age of Onset  . Breast cancer Other        maternal aunt, 2nd cousin  .  Coronary artery disease Father 56  . Colon cancer Paternal Grandmother   . Esophageal cancer Neg Hx   . Stomach cancer Neg Hx   . Rectal cancer Neg Hx     Review of Systems  Genitourinary: Positive for dyspareunia.  All other systems reviewed and are negative.   Exam:   BP 128/70   Pulse 88   Temp 97.9 F (36.6 C) (Temporal)   Ht 5\' 5"  (1.651 m)   Wt 133 lb (60.3 kg)   BMI 22.13 kg/m   Height: 5\' 5"  (165.1 cm)  Ht Readings from Last 3 Encounters:  09/07/18 5\' 5"  (1.651 m)  01/31/18 5\' 6"  (1.676 m)  01/29/18 5\' 6"  (1.676 m)    General appearance: alert, cooperative and appears stated age Head: Normocephalic, without obvious abnormality, atraumatic Neck: no adenopathy, supple, symmetrical, trachea midline and thyroid normal to inspection and palpation Lungs: clear to auscultation bilaterally Breasts: normal  appearance, no masses or tenderness Heart: regular rate and rhythm Abdomen: soft, non-tender; bowel sounds normal; no masses,  no organomegaly Extremities: extremities normal, atraumatic, no cyanosis or edema Skin: Skin color, texture, turgor normal. No rashes or lesions Lymph nodes: Cervical, supraclavicular, and axillary nodes normal. No abnormal inguinal nodes palpated Neurologic: Grossly normal   Pelvic: External genitalia:  no lesions              Urethra:  normal appearing urethra with no masses, tenderness or lesions              Bartholins and Skenes: normal                 Vagina: normal appearing vagina with normal color and discharge, no lesions              Cervix: no lesions              Pap taken: No. Bimanual Exam:  Uterus:  normal size, contour, position, consistency, mobility, non-tender              Adnexa: normal adnexa and no mass, fullness, tenderness               Rectovaginal: Confirms               Anus:  normal sphincter tone, no lesions  Chaperone was present for exam.  A:  Well Woman with normal exam PMP, no HRT Dyspareunia H/o lumpectomy due to radial scar Family hx of breast cancer in maternal aunt and second cousin  P:   Mammogram guidelines reviewed. pap smear with neg HR HPV 5/18 Colonoscopy UTD CBC, CMP, Lipids, TSH and Vit D obtained today Tdap due in two years Trial of topical lidocaine 5% ointment to skin.  Rx to pharmacy. Return annually or prn

## 2018-09-08 ENCOUNTER — Encounter: Payer: Self-pay | Admitting: *Deleted

## 2018-09-08 LAB — COMPREHENSIVE METABOLIC PANEL
ALT: 10 IU/L (ref 0–32)
AST: 16 IU/L (ref 0–40)
Albumin/Globulin Ratio: 2.6 — ABNORMAL HIGH (ref 1.2–2.2)
Albumin: 5 g/dL — ABNORMAL HIGH (ref 3.8–4.9)
Alkaline Phosphatase: 54 IU/L (ref 39–117)
BUN/Creatinine Ratio: 14 (ref 9–23)
BUN: 10 mg/dL (ref 6–24)
Bilirubin Total: 0.4 mg/dL (ref 0.0–1.2)
CO2: 26 mmol/L (ref 20–29)
Calcium: 10 mg/dL (ref 8.7–10.2)
Chloride: 102 mmol/L (ref 96–106)
Creatinine, Ser: 0.73 mg/dL (ref 0.57–1.00)
GFR calc Af Amer: 106 mL/min/{1.73_m2} (ref >59)
GFR calc non Af Amer: 92 mL/min/{1.73_m2} (ref >59)
Globulin, Total: 1.9 g/dL (ref 1.5–4.5)
Glucose: 85 mg/dL (ref 65–99)
Potassium: 4.2 mmol/L (ref 3.5–5.2)
Sodium: 144 mmol/L (ref 134–144)
Total Protein: 6.9 g/dL (ref 6.0–8.5)

## 2018-09-08 LAB — CBC
Hematocrit: 41.8 % (ref 34.0–46.6)
Hemoglobin: 14 g/dL (ref 11.1–15.9)
MCH: 31.5 pg (ref 26.6–33.0)
MCHC: 33.5 g/dL (ref 31.5–35.7)
MCV: 94 fL (ref 79–97)
Platelets: 209 10*3/uL (ref 150–450)
RBC: 4.44 x10E6/uL (ref 3.77–5.28)
RDW: 12 % (ref 11.7–15.4)
WBC: 4.3 10*3/uL (ref 3.4–10.8)

## 2018-09-08 LAB — TSH: TSH: 1.97 u[IU]/mL (ref 0.450–4.500)

## 2018-09-08 LAB — LIPID PANEL
Chol/HDL Ratio: 2.6 ratio (ref 0.0–4.4)
Cholesterol, Total: 189 mg/dL (ref 100–199)
HDL: 73 mg/dL (ref >39)
LDL Chol Calc (NIH): 103 mg/dL — ABNORMAL HIGH (ref 0–99)
Triglycerides: 72 mg/dL (ref 0–149)
VLDL Cholesterol Cal: 13 mg/dL (ref 5–40)

## 2018-09-08 LAB — VITAMIN D 25 HYDROXY (VIT D DEFICIENCY, FRACTURES): Vit D, 25-Hydroxy: 51 ng/mL (ref 30.0–100.0)

## 2018-10-24 ENCOUNTER — Encounter

## 2018-10-24 ENCOUNTER — Ambulatory Visit: Payer: BC Managed Care – PPO | Admitting: Obstetrics & Gynecology

## 2018-10-27 ENCOUNTER — Other Ambulatory Visit: Payer: Self-pay

## 2018-10-27 ENCOUNTER — Ambulatory Visit
Admission: RE | Admit: 2018-10-27 | Discharge: 2018-10-27 | Disposition: A | Discharge status: Home / Self Care | Payer: BC Managed Care – PPO | Source: Ambulatory Visit | Attending: Obstetrics & Gynecology | Admitting: Obstetrics & Gynecology

## 2018-10-27 DIAGNOSIS — E2839 Other primary ovarian failure: Secondary | ICD-10-CM

## 2018-10-30 ENCOUNTER — Ambulatory Visit: Payer: BC Managed Care – PPO | Admitting: Obstetrics & Gynecology

## 2019-07-06 ENCOUNTER — Other Ambulatory Visit: Payer: Self-pay | Admitting: Obstetrics & Gynecology

## 2019-07-06 DIAGNOSIS — Z1231 Encounter for screening mammogram for malignant neoplasm of breast: Secondary | ICD-10-CM

## 2019-08-17 ENCOUNTER — Ambulatory Visit
Admission: RE | Admit: 2019-08-17 | Discharge: 2019-08-17 | Disposition: A | Discharge status: Home / Self Care | Payer: BC Managed Care – PPO | Source: Ambulatory Visit | Attending: Obstetrics & Gynecology | Admitting: Obstetrics & Gynecology

## 2019-08-17 ENCOUNTER — Other Ambulatory Visit: Payer: Self-pay

## 2019-08-17 DIAGNOSIS — Z1231 Encounter for screening mammogram for malignant neoplasm of breast: Secondary | ICD-10-CM

## 2019-10-05 NOTE — Progress Notes (Signed)
59 y.o. G32P1011 Married White or Caucasian female here for annual exam.  Doing well.  Denies vaginal bleeding.    Husband is doing well.  He still has erectile dysfunction.  Had surgery at [facility].    She is planning on having the Shingrix vaccination.  Has questions about this.    PCP:  Dr. Gerarda Fraction  No LMP recorded. Patient has had an ablation.          Sexually active: No.  The current method of family planning is post menopausal status.    Exercising: Yes.    walking Smoker:  no  Health Maintenance: Pap:  05-06-16 neg HPV HR neg History of abnormal Pap:  yes MMG:  08-17-2019 category c density birads 1:neg Colonoscopy:  03-09-13 f/u 10 yrs BMD:   10-27-2018 osteopenia f/u 3-78yrs TDaP:  2013 Pneumonia vaccine(s):  no Shingrix:   no Hep C testing: neg 2018 Screening Labs: done in 09/2000   reports that she has never smoked. She has never used smokeless tobacco. She reports that she does not drink alcohol and does not use drugs.  Past Medical History:  Diagnosis Date  . Abnormal Pap smear   . Basal cell carcinoma   . Broken ankle    left  . Distal radial fracture    due to fall   . Headache   . IBS (irritable bowel syndrome)   . Scoliosis   . Torn rotator cuff    right shoulder-had cortisone injections    Past Surgical History:  Procedure Laterality Date  . BREAST BIOPSY Left   . BREAST EXCISIONAL BIOPSY Left 02/25/2015   excision of radial scar with calcs  . BREAST LUMPECTOMY WITH RADIOACTIVE SEED LOCALIZATION Left 02/25/2015   Procedure: LEFT BREAST LUMPECTOMY WITH RADIOACTIVE SEED LOCALIZATION;  Surgeon: Rolm Bookbinder, MD;  Location: [place];  Service: General;  Laterality: Left;  . CERVICAL BIOPSY    . GYNECOLOGIC CRYOSURGERY     CIN I  . HTA     polyp resection  . KNEE CARTILAGE SURGERY     right knee  . MOHS SURGERY    . OPEN REDUCTION INTERNAL FIXATION (ORIF) DISTAL RADIAL FRACTURE Left 01/31/2018   Procedure: OPEN REDUCTION INTERNAL FIXATION  (ORIF) DISTAL RADIAL FRACTURE;  Surgeon: Roseanne Kaufman, MD;  Location: [place];  Service: Orthopedics;  Laterality: Left;  . RECTAL SURGERY     posterior/rectal sphincter repair    Current Outpatient Medications  Medication Sig Dispense Refill  . acetaminophen (TYLENOL) 500 MG tablet Take 500 mg by mouth every 6 (six) hours as needed.    . Calcium 600-200 MG-UNIT tablet Take 1 tablet by mouth.     . fluticasone (FLONASE) 50 MCG/ACT nasal spray Place 1 spray into both nostrils daily.    Marland Kitchen ibuprofen (ADVIL,MOTRIN) 200 MG tablet Take 800 mg by mouth every 8 (eight) hours as needed (for pain.).     Marland Kitchen Probiotic Product (PROBIOTIC DAILY PO) Take 1 capsule by mouth daily.     . TURMERIC PO Take 1 capsule by mouth daily with lunch. Turmeric Supreme Extra Strength    . lidocaine (XYLOCAINE) 5 % ointment Apply 1 application topically daily as needed. (Patient not taking: Reported on 10/08/2019) 1.25 g 0   No current facility-administered medications for this visit.    Family History  Problem Relation Age of Onset  . Breast cancer Other        maternal aunt, 2nd cousin  . Coronary artery disease Father 57  .  Colon cancer Paternal Grandmother   . Esophageal cancer Neg Hx   . Stomach cancer Neg Hx   . Rectal cancer Neg Hx     Review of Systems  Constitutional: Negative.   HENT: Negative.   Eyes: Negative.   Respiratory: Negative.   Cardiovascular: Negative.   Gastrointestinal: Negative.   Endocrine: Negative.   Genitourinary: Negative.   Musculoskeletal: Negative.   Skin: Negative.   Allergic/Immunologic: Negative.   Neurological: Negative.   Hematological: Negative.   Psychiatric/Behavioral: Negative.     Exam:   BP 120/64   Pulse 72   Resp 16   Ht 5' 4.75" (1.645 m)   Wt 135 lb (61.2 kg)   BMI 22.64 kg/m   Height: 5' 4.75" (164.5 cm)  General appearance: alert, cooperative and appears stated age Head: Normocephalic, without obvious abnormality, atraumatic Neck: no  adenopathy, supple, symmetrical, trachea midline and thyroid normal to inspection and palpation Lungs: clear to auscultation bilaterally Breasts: normal appearance, no masses or tenderness Heart: regular rate and rhythm Abdomen: soft, non-tender; bowel sounds normal; no masses,  no organomegaly Extremities: extremities normal, atraumatic, no cyanosis or edema Skin: Skin color, texture, turgor normal. No rashes or lesions Lymph nodes: Cervical, supraclavicular, and axillary nodes normal. No abnormal inguinal nodes palpated Neurologic: Grossly normal   Pelvic: External genitalia:  no lesions              Urethra:  normal appearing urethra with no masses, tenderness or lesions              Bartholins and Skenes: normal                 Vagina: normal appearing vagina with normal color and discharge, no lesions              Cervix: no lesions              Pap taken: Yes.   Bimanual Exam:  Uterus:  normal size, contour, position, consistency, mobility, non-tender              Adnexa: normal adnexa and no mass, fullness, tenderness               Rectovaginal: Confirms               Anus:  normal sphincter tone, no lesions  Chaperone, Royal Hawthorn, CMA, was present for exam.  A:  Well Woman with normal exam PMP, no HRT Dyspareunia H/o lumpectomy due to radial scar H/o CIN 1 Family hx of breast cancer in maternal aunt and second cousin  [place], lifetime risk 21.4%  P:   Mammogram guidelines reviewed.  Doing yearly MMG.  Breast MRI discussed with pt.  She would like to proceed with scheduling.  Precert will be done first. pap smear with HR HPV obtained today Colonoscopy up to date Tdap due in 2 years Lab work done 2020 Vaccines reviewed Return annually or prn

## 2019-10-08 ENCOUNTER — Encounter: Payer: Self-pay | Admitting: Obstetrics & Gynecology

## 2019-10-08 ENCOUNTER — Other Ambulatory Visit: Payer: Self-pay

## 2019-10-08 ENCOUNTER — Other Ambulatory Visit (HOSPITAL_COMMUNITY)
Admission: RE | Admit: 2019-10-08 | Discharge: 2019-10-08 | Disposition: A | Discharge status: Home / Self Care | Payer: BC Managed Care – PPO | Source: Ambulatory Visit | Attending: Obstetrics & Gynecology | Admitting: Obstetrics & Gynecology

## 2019-10-08 ENCOUNTER — Ambulatory Visit (INDEPENDENT_AMBULATORY_CARE_PROVIDER_SITE_OTHER): Payer: BC Managed Care – PPO | Admitting: Obstetrics & Gynecology

## 2019-10-08 ENCOUNTER — Telehealth: Payer: Self-pay | Admitting: *Deleted

## 2019-10-08 VITALS — BP 120/64 | HR 72 | Resp 16 | Ht 64.75 in | Wt 135.0 lb

## 2019-10-08 DIAGNOSIS — Z01419 Encounter for gynecological examination (general) (routine) without abnormal findings: Secondary | ICD-10-CM

## 2019-10-08 DIAGNOSIS — Z124 Encounter for screening for malignant neoplasm of cervix: Secondary | ICD-10-CM | POA: Insufficient documentation

## 2019-10-08 DIAGNOSIS — Z9189 Other specified personal risk factors, not elsewhere classified: Secondary | ICD-10-CM

## 2019-10-08 DIAGNOSIS — Z803 Family history of malignant neoplasm of breast: Secondary | ICD-10-CM

## 2019-10-08 DIAGNOSIS — N6489 Other specified disorders of breast: Secondary | ICD-10-CM

## 2019-10-08 NOTE — Telephone Encounter (Signed)
Spoke with patient, advised order has been placed for MRI Breast bilateral w/wo contrast at [facility]. They will contact her directly to schedule, our office will precert once scheduled. Patient verbalizes understanding and is agreeable.   IMG hold.   Routing to [facility] [employer].   Encounter closed.

## 2019-10-08 NOTE — Telephone Encounter (Signed)
-----   Message from Megan Salon, MD sent at 10/08/2019 10:29 AM EDT ----- Regarding: breast MRI Sharee Pimple, This pt has a lifetime risk of 21% for breast cancer.  She would like to proceed with breast MRI.  Can you precert this?  She also has hx of breast radial scar that was removed with lumpectomy and family hx of breast cancer.  Thanks.  Vinnie Level

## 2019-10-09 LAB — CYTOLOGY - PAP
Comment: NEGATIVE
Diagnosis: NEGATIVE
High risk HPV: NEGATIVE

## 2019-10-10 ENCOUNTER — Telehealth: Payer: Self-pay

## 2019-10-10 NOTE — Telephone Encounter (Signed)
Patient is calling to update doctor. Patient states she "will not be getting breast MRI this year due to insurance and will wait until next year when can see Dr. Sabra Heck again".

## 2019-10-10 NOTE — Telephone Encounter (Signed)
Routing to Dr Sabra Heck for review.

## 2019-10-10 NOTE — Telephone Encounter (Signed)
Thanks for the update.  Ok to close encounter.

## 2019-10-30 ENCOUNTER — Other Ambulatory Visit: Payer: BC Managed Care – PPO

## 2019-11-16 ENCOUNTER — Ambulatory Visit: Payer: BC Managed Care – PPO | Admitting: Obstetrics & Gynecology

## 2020-07-08 ENCOUNTER — Other Ambulatory Visit: Payer: Self-pay | Admitting: Obstetrics & Gynecology

## 2020-07-08 DIAGNOSIS — Z1231 Encounter for screening mammogram for malignant neoplasm of breast: Secondary | ICD-10-CM

## 2020-09-05 ENCOUNTER — Ambulatory Visit
Admission: RE | Admit: 2020-09-05 | Discharge: 2020-09-05 | Disposition: A | Discharge status: Home / Self Care | Payer: BC Managed Care – PPO | Source: Ambulatory Visit | Attending: Obstetrics & Gynecology | Admitting: Obstetrics & Gynecology

## 2020-09-05 ENCOUNTER — Other Ambulatory Visit: Payer: Self-pay

## 2020-09-05 DIAGNOSIS — Z1231 Encounter for screening mammogram for malignant neoplasm of breast: Secondary | ICD-10-CM

## 2020-12-01 ENCOUNTER — Ambulatory Visit (HOSPITAL_BASED_OUTPATIENT_CLINIC_OR_DEPARTMENT_OTHER): Payer: BC Managed Care – PPO | Admitting: Obstetrics & Gynecology

## 2020-12-04 ENCOUNTER — Encounter (HOSPITAL_BASED_OUTPATIENT_CLINIC_OR_DEPARTMENT_OTHER): Payer: Self-pay | Admitting: Obstetrics & Gynecology

## 2020-12-04 ENCOUNTER — Other Ambulatory Visit: Payer: Self-pay

## 2020-12-04 ENCOUNTER — Ambulatory Visit (INDEPENDENT_AMBULATORY_CARE_PROVIDER_SITE_OTHER): Payer: BC Managed Care – PPO | Admitting: Obstetrics & Gynecology

## 2020-12-04 VITALS — BP 124/74 | HR 84 | Ht 65.0 in | Wt 130.6 lb

## 2020-12-04 DIAGNOSIS — Z0000 Encounter for general adult medical examination without abnormal findings: Secondary | ICD-10-CM | POA: Diagnosis not present

## 2020-12-04 DIAGNOSIS — Z01419 Encounter for gynecological examination (general) (routine) without abnormal findings: Secondary | ICD-10-CM | POA: Diagnosis not present

## 2020-12-04 DIAGNOSIS — N952 Postmenopausal atrophic vaginitis: Secondary | ICD-10-CM | POA: Diagnosis not present

## 2020-12-04 DIAGNOSIS — Z1239 Encounter for other screening for malignant neoplasm of breast: Secondary | ICD-10-CM | POA: Diagnosis not present

## 2020-12-04 DIAGNOSIS — Z87410 Personal history of cervical dysplasia: Secondary | ICD-10-CM

## 2020-12-04 DIAGNOSIS — Z803 Family history of malignant neoplasm of breast: Secondary | ICD-10-CM

## 2020-12-04 NOTE — Progress Notes (Signed)
60 y.o. G1P1011 Married White or Caucasian female here for annual exam.  Lost both parents this past year.  Parents both had Covid this year.  Mother had trouble getting over Covid and ended up with AML.  Sister newly diagnosed with breast cancer.  She had genetic testing that was negative.  [place] model done today and lifetime risk is now 24%.  Breast MRI discussed.  Will plan in early 2023.  Denies vaginal bleeding.  Continues to have vaginal dryness and tightness/pain with intercourse.  We discussed Josph Macho touch.  No LMP recorded. Patient has had an ablation.          Sexually active: Yes.    The current method of family planning is post menopausal status.    Exercising: yes Smoker:  yes  Health Maintenance: Pap: 10/08/2019 Negative History of abnormal Pap:  CIN1 MMG:  09/05/2020 Negative Colonoscopy:  03/09/2013 BMD:   10/27/2018 Osteopenia Screening Labs: will do today   reports that she has never smoked. She has never used smokeless tobacco. She reports that she does not drink alcohol and does not use drugs.  Past Medical History:  Diagnosis Date   Abnormal Pap smear    Basal cell carcinoma    Broken ankle    left   Distal radial fracture    due to fall    Headache    IBS (irritable bowel syndrome)    Scoliosis    Torn rotator cuff    right shoulder-had cortisone injections    Past Surgical History:  Procedure Laterality Date   BREAST BIOPSY Left    BREAST EXCISIONAL BIOPSY Left 02/25/2015   excision of radial scar with calcs   BREAST LUMPECTOMY WITH RADIOACTIVE SEED LOCALIZATION Left 02/25/2015   Procedure: LEFT BREAST LUMPECTOMY WITH RADIOACTIVE SEED LOCALIZATION;  Surgeon: Rolm Bookbinder, MD;  Location: [place];  Service: General;  Laterality: Left;   CERVICAL BIOPSY     GYNECOLOGIC CRYOSURGERY     CIN I   HTA     polyp resection   KNEE CARTILAGE SURGERY     right knee   MOHS SURGERY     OPEN REDUCTION INTERNAL FIXATION (ORIF)  DISTAL RADIAL FRACTURE Left 01/31/2018   Procedure: OPEN REDUCTION INTERNAL FIXATION (ORIF) DISTAL RADIAL FRACTURE;  Surgeon: Roseanne Kaufman, MD;  Location: [place];  Service: Orthopedics;  Laterality: Left;   RECTAL SURGERY     posterior/rectal sphincter repair    Current Outpatient Medications  Medication Sig Dispense Refill   acetaminophen (TYLENOL) 500 MG tablet Take 500 mg by mouth every 6 (six) hours as needed.     fluticasone (FLONASE) 50 MCG/ACT nasal spray Place 1 spray into both nostrils daily.     ibuprofen (ADVIL,MOTRIN) 200 MG tablet Take 800 mg by mouth every 8 (eight) hours as needed (for pain.).      Probiotic Product (PROBIOTIC DAILY PO) Take 1 capsule by mouth daily.      TURMERIC PO Take 1 capsule by mouth daily with lunch. Turmeric Supreme Extra Strength     Calcium 600-200 MG-UNIT tablet Take 1 tablet by mouth.  (Patient not taking: Reported on 12/04/2020)     lidocaine (XYLOCAINE) 5 % ointment Apply 1 application topically daily as needed. (Patient not taking: Reported on 12/04/2020) 1.25 g 0   No current facility-administered medications for this visit.    Family History  Problem Relation Age of Onset   Coronary artery disease Father 7   Breast cancer Sister  Colon cancer Paternal Grandmother    Breast cancer Other        maternal aunt, 2nd cousin   Esophageal cancer Neg Hx    Stomach cancer Neg Hx    Rectal cancer Neg Hx     Review of Systems  All other systems reviewed and are negative.  Exam:   BP 124/74 (BP Location: Left Arm, Patient Position: Sitting, Cuff Size: Normal)   Pulse 84   Ht 5\' 5"  (1.651 m)   Wt 130 lb 9.6 oz (59.2 kg)   BMI 21.73 kg/m   Height: 5\' 5"  (165.1 cm)  General appearance: alert, cooperative and appears stated age Head: Normocephalic, without obvious abnormality, atraumatic Neck: no adenopathy, supple, symmetrical, trachea midline and thyroid normal to inspection and palpation Lungs: clear to auscultation  bilaterally Breasts: normal appearance, no masses or tenderness Heart: regular rate and rhythm Abdomen: soft, non-tender; bowel sounds normal; no masses,  no organomegaly Extremities: extremities normal, atraumatic, no cyanosis or edema Skin: Skin color, texture, turgor normal. No rashes or lesions Lymph nodes: Cervical, supraclavicular, and axillary nodes normal. No abnormal inguinal nodes palpated Neurologic: Grossly normal   Pelvic: External genitalia:  no lesions              Urethra:  normal appearing urethra with no masses, tenderness or lesions              Bartholins and Skenes: normal                 Vagina: normal appearing vagina with normal color and no discharge, no lesions              Cervix: no lesions              Pap taken: No. Bimanual Exam:  Uterus:  normal size, contour, position, consistency, mobility, non-tender              Adnexa: normal adnexa and no mass, fullness, tenderness               Rectovaginal: Confirms               Anus:  normal sphincter tone, no lesions  Chaperone, Octaviano Batty, CMA, was present for exam.  Assessment/Plan: 1. Well woman exam with routine gynecological exam - pap neg with neg HR HPV 2021.  Not indicated today. - MMG 09/2020 - BMD 10/2018 - colonoscopy 2015 - lab work ordered today - care gaps/vaccines reviewed and updated  2. Breast cancer screening, high risk patient - [place] model today with 24% lifetime risk. - discussed yearly diagnostic breast MRI for additional screening.  Pt desires to proceed after Jan 1st. - MR BREAST RIGHT W WO CONTRAST INC CAD; Future  3. Blood tests for routine general physical examination - CBC - Comprehensive metabolic panel - Lipid panel - TSH - VITAMIN D 25 Hydroxy (Vit-D Deficiency, Fractures)  4. Atrophy of vagina - will try [place] - we discussed Josph Macho Touch again today as well  5. History of cervical dysplasia - h/o CIN 1

## 2020-12-05 LAB — CBC
Hematocrit: 39.8 % (ref 34.0–46.6)
Hemoglobin: 13.4 g/dL (ref 11.1–15.9)
MCH: 30.3 pg (ref 26.6–33.0)
MCHC: 33.7 g/dL (ref 31.5–35.7)
MCV: 90 fL (ref 79–97)
Platelets: 185 10*3/uL (ref 150–450)
RBC: 4.42 x10E6/uL (ref 3.77–5.28)
RDW: 11.2 % — ABNORMAL LOW (ref 11.7–15.4)
WBC: 4.7 10*3/uL (ref 3.4–10.8)

## 2020-12-05 LAB — LIPID PANEL
Chol/HDL Ratio: 2.7 ratio (ref 0.0–4.4)
Cholesterol, Total: 189 mg/dL (ref 100–199)
HDL: 69 mg/dL (ref >39)
LDL Chol Calc (NIH): 104 mg/dL — ABNORMAL HIGH (ref 0–99)
Triglycerides: 90 mg/dL (ref 0–149)
VLDL Cholesterol Cal: 16 mg/dL (ref 5–40)

## 2020-12-05 LAB — COMPREHENSIVE METABOLIC PANEL
ALT: 10 IU/L (ref 0–32)
AST: 18 IU/L (ref 0–40)
Albumin/Globulin Ratio: 2.5 — ABNORMAL HIGH (ref 1.2–2.2)
Albumin: 4.9 g/dL (ref 3.8–4.9)
Alkaline Phosphatase: 53 IU/L (ref 44–121)
BUN/Creatinine Ratio: 17 (ref 9–23)
BUN: 13 mg/dL (ref 6–24)
Bilirubin Total: 0.5 mg/dL (ref 0.0–1.2)
CO2: 26 mmol/L (ref 20–29)
Calcium: 9.4 mg/dL (ref 8.7–10.2)
Chloride: 99 mmol/L (ref 96–106)
Creatinine, Ser: 0.77 mg/dL (ref 0.57–1.00)
Globulin, Total: 2 g/dL (ref 1.5–4.5)
Glucose: 86 mg/dL (ref 70–99)
Potassium: 4.3 mmol/L (ref 3.5–5.2)
Sodium: 141 mmol/L (ref 134–144)
Total Protein: 6.9 g/dL (ref 6.0–8.5)
eGFR: 89 mL/min/{1.73_m2} (ref >59)

## 2020-12-05 LAB — VITAMIN D 25 HYDROXY (VIT D DEFICIENCY, FRACTURES): Vit D, 25-Hydroxy: 37.6 ng/mL (ref 30.0–100.0)

## 2020-12-05 LAB — TSH: TSH: 1.4 u[IU]/mL (ref 0.450–4.500)

## 2021-01-08 NOTE — Addendum Note (Signed)
Addended by: Blenda Nicely on: 01/08/2021 09:48 AM   Modules accepted: Orders

## 2021-01-21 ENCOUNTER — Ambulatory Visit
Admission: RE | Admit: 2021-01-21 | Discharge: 2021-01-21 | Disposition: A | Discharge status: Home / Self Care | Payer: BC Managed Care – PPO | Source: Ambulatory Visit | Attending: Obstetrics & Gynecology | Admitting: Obstetrics & Gynecology

## 2021-01-21 DIAGNOSIS — Z1239 Encounter for other screening for malignant neoplasm of breast: Secondary | ICD-10-CM

## 2021-01-21 MED ORDER — GADOBUTROL 1 MMOL/ML IV SOLN
6.0000 mL | Freq: Once | INTRAVENOUS | Status: AC | PRN
  Administered 2021-01-21: 6 mL via INTRAVENOUS

## 2021-08-03 ENCOUNTER — Other Ambulatory Visit: Payer: Self-pay | Admitting: Obstetrics & Gynecology

## 2021-08-03 DIAGNOSIS — Z1231 Encounter for screening mammogram for malignant neoplasm of breast: Secondary | ICD-10-CM

## 2021-09-08 ENCOUNTER — Ambulatory Visit
Admission: RE | Admit: 2021-09-08 | Discharge: 2021-09-08 | Disposition: A | Discharge status: Home / Self Care | Payer: BC Managed Care – PPO | Source: Ambulatory Visit | Attending: Obstetrics & Gynecology | Admitting: Obstetrics & Gynecology

## 2021-09-08 DIAGNOSIS — Z1231 Encounter for screening mammogram for malignant neoplasm of breast: Secondary | ICD-10-CM

## 2021-12-07 ENCOUNTER — Ambulatory Visit (INDEPENDENT_AMBULATORY_CARE_PROVIDER_SITE_OTHER): Payer: BC Managed Care – PPO | Admitting: Obstetrics & Gynecology

## 2021-12-07 ENCOUNTER — Encounter (HOSPITAL_BASED_OUTPATIENT_CLINIC_OR_DEPARTMENT_OTHER): Payer: Self-pay | Admitting: Obstetrics & Gynecology

## 2021-12-07 VITALS — BP 122/78 | HR 78 | Ht 64.5 in | Wt 131.6 lb

## 2021-12-07 DIAGNOSIS — Z01419 Encounter for gynecological examination (general) (routine) without abnormal findings: Secondary | ICD-10-CM

## 2021-12-07 DIAGNOSIS — N6489 Other specified disorders of breast: Secondary | ICD-10-CM | POA: Diagnosis not present

## 2021-12-07 DIAGNOSIS — Z23 Encounter for immunization: Secondary | ICD-10-CM | POA: Diagnosis not present

## 2021-12-07 DIAGNOSIS — N952 Postmenopausal atrophic vaginitis: Secondary | ICD-10-CM

## 2021-12-07 DIAGNOSIS — Z87410 Personal history of cervical dysplasia: Secondary | ICD-10-CM

## 2021-12-07 DIAGNOSIS — Z803 Family history of malignant neoplasm of breast: Secondary | ICD-10-CM

## 2021-12-07 NOTE — Progress Notes (Signed)
61 y.o. G27P1011 Married White or Caucasian female here for annual exam.  Doing well.  Denies vaginal bleeding.  Did breast MRI in January.  We discussed today.  She will plan breast MRI again in 2025.  Pt does not want to repeat this next year.  Lifetime risk of 24% of breast cancer.    Does have vaginal dryness and tightness. Husband had prostate surgery so they are less active.    Patient's last menstrual period was 08/04/2012.          Sexually active: Yes.    The current method of family planning is post menopausal status.    Smoker:  no  Health Maintenance: Pap:  10/08/19 neg, neg HR HPV History of abnormal Pap:  CIN1 MMG:  09/08/21.  MRI 01/21/2021.   Colonoscopy:  03/09/13, follow up 10 years BMD:   10/27/18, osteopenia Screening Labs: 12/04/2020   reports that she has never smoked. She has never used smokeless tobacco. She reports that she does not drink alcohol and does not use drugs.  Past Medical History:  Diagnosis Date   Abnormal Pap smear    Basal cell carcinoma    Broken ankle    left   Distal radial fracture    due to fall    Headache    IBS (irritable bowel syndrome)    Scoliosis    Torn rotator cuff    right shoulder-had cortisone injections    Past Surgical History:  Procedure Laterality Date   BREAST BIOPSY Left    BREAST EXCISIONAL BIOPSY Left 02/25/2015   excision of radial scar with calcs   BREAST LUMPECTOMY WITH RADIOACTIVE SEED LOCALIZATION Left 02/25/2015   Procedure: LEFT BREAST LUMPECTOMY WITH RADIOACTIVE SEED LOCALIZATION;  Surgeon: Rolm Bookbinder, MD;  Location: [place];  Service: General;  Laterality: Left;   CERVICAL BIOPSY     GYNECOLOGIC CRYOSURGERY     CIN I   HTA     polyp resection   KNEE CARTILAGE SURGERY     right knee   MOHS SURGERY     OPEN REDUCTION INTERNAL FIXATION (ORIF) DISTAL RADIAL FRACTURE Left 01/31/2018   Procedure: OPEN REDUCTION INTERNAL FIXATION (ORIF) DISTAL RADIAL FRACTURE;  Surgeon: Roseanne Kaufman,  MD;  Location: [place];  Service: Orthopedics;  Laterality: Left;   RECTAL SURGERY     posterior/rectal sphincter repair    Current Outpatient Medications  Medication Sig Dispense Refill   acetaminophen (TYLENOL) 500 MG tablet Take 500 mg by mouth every 6 (six) hours as needed.     fluticasone (FLONASE) 50 MCG/ACT nasal spray Place 1 spray into both nostrils daily.     ibuprofen (ADVIL,MOTRIN) 200 MG tablet Take 800 mg by mouth every 8 (eight) hours as needed (for pain.).      lidocaine (XYLOCAINE) 5 % ointment Apply 1 application topically daily as needed. 1.25 g 0   Probiotic Product (PROBIOTIC DAILY PO) Take 1 capsule by mouth daily.      TURMERIC PO Take 1 capsule by mouth daily with lunch. Turmeric Supreme Extra Strength     No current facility-administered medications for this visit.    Family History  Problem Relation Age of Onset   Coronary artery disease Father 26   Breast cancer Sister    Breast cancer Maternal Aunt    Colon cancer Paternal Grandmother    Esophageal cancer Neg Hx    Stomach cancer Neg Hx    Rectal cancer Neg Hx     ROS:  Constitutional: negative Genitourinary:negative  Exam:   BP 122/78   Pulse 78   Ht 5' 4.5" (1.638 m)   Wt 131 lb 9.6 oz (59.7 kg)   LMP 08/04/2012 Comment: two months just one time spotting  BMI 22.24 kg/m   Height: 5' 4.5" (163.8 cm)  General appearance: alert, cooperative and appears stated age Head: Normocephalic, without obvious abnormality, atraumatic Neck: no adenopathy, supple, symmetrical, trachea midline and thyroid normal to inspection and palpation Lungs: clear to auscultation bilaterally Breasts: normal appearance, no masses or tenderness Heart: regular rate and rhythm Abdomen: soft, non-tender; bowel sounds normal; no masses,  no organomegaly Extremities: extremities normal, atraumatic, no cyanosis or edema Skin: Skin color, texture, turgor normal. No rashes or lesions Lymph nodes: Cervical, supraclavicular, and  axillary nodes normal. No abnormal inguinal nodes palpated Neurologic: Grossly normal   Pelvic: External genitalia:  no lesions              Urethra:  normal appearing urethra with no masses, tenderness or lesions              Bartholins and Skenes: normal                 Vagina: normal appearing vagina with normal color and no discharge, no lesions              Cervix: no lesions              Pap taken: No. Bimanual Exam:  Uterus:  normal size, contour, position, consistency, mobility, non-tender              Adnexa: normal adnexa and no mass, fullness, tenderness               Rectovaginal: Confirms               Anus:  normal sphincter tone, no lesions  Chaperone, Octaviano Batty, CMA, was present for exam.  Assessment/Plan: 1. Well woman exam with routine gynecological exam - Pap smear 11/2019 - Mammogram 09/09/2021 - Colonoscopy 03/2013.  Follow up 10 years - Bone mineral density 2020 - lab work done last year - vaccines reviewed/updated.  Updated Tdap.  2. Atrophy of vagina  3. Radial scar of breast - plan breat MRI 2025  4. History of cervical dysplasia - h/o CIN 1  5. Family history of breast cancer

## 2022-08-09 ENCOUNTER — Other Ambulatory Visit: Payer: Self-pay | Admitting: Obstetrics & Gynecology

## 2022-08-09 DIAGNOSIS — Z1231 Encounter for screening mammogram for malignant neoplasm of breast: Secondary | ICD-10-CM

## 2022-09-10 ENCOUNTER — Ambulatory Visit
Admission: RE | Admit: 2022-09-10 | Discharge: 2022-09-10 | Disposition: A | Discharge status: Home / Self Care | Payer: BC Managed Care – PPO | Source: Ambulatory Visit | Attending: Obstetrics & Gynecology | Admitting: Obstetrics & Gynecology

## 2022-09-10 DIAGNOSIS — Z1231 Encounter for screening mammogram for malignant neoplasm of breast: Secondary | ICD-10-CM

## 2022-12-13 ENCOUNTER — Other Ambulatory Visit (HOSPITAL_COMMUNITY)
Admission: RE | Admit: 2022-12-13 | Discharge: 2022-12-13 | Disposition: A | Discharge status: Home / Self Care | Payer: BC Managed Care – PPO | Source: Ambulatory Visit | Attending: Obstetrics & Gynecology | Admitting: Obstetrics & Gynecology

## 2022-12-13 ENCOUNTER — Ambulatory Visit (HOSPITAL_BASED_OUTPATIENT_CLINIC_OR_DEPARTMENT_OTHER): Payer: BC Managed Care – PPO | Admitting: Obstetrics & Gynecology

## 2022-12-13 VITALS — BP 112/73 | HR 86 | Ht 65.0 in | Wt 130.6 lb

## 2022-12-13 DIAGNOSIS — Z87410 Personal history of cervical dysplasia: Secondary | ICD-10-CM

## 2022-12-13 DIAGNOSIS — Z803 Family history of malignant neoplasm of breast: Secondary | ICD-10-CM | POA: Diagnosis not present

## 2022-12-13 DIAGNOSIS — Z1211 Encounter for screening for malignant neoplasm of colon: Secondary | ICD-10-CM

## 2022-12-13 DIAGNOSIS — Z124 Encounter for screening for malignant neoplasm of cervix: Secondary | ICD-10-CM | POA: Diagnosis present

## 2022-12-13 DIAGNOSIS — Z01419 Encounter for gynecological examination (general) (routine) without abnormal findings: Secondary | ICD-10-CM | POA: Diagnosis not present

## 2022-12-13 DIAGNOSIS — N6489 Other specified disorders of breast: Secondary | ICD-10-CM

## 2022-12-13 DIAGNOSIS — Z0000 Encounter for general adult medical examination without abnormal findings: Secondary | ICD-10-CM

## 2022-12-13 DIAGNOSIS — N952 Postmenopausal atrophic vaginitis: Secondary | ICD-10-CM

## 2022-12-13 NOTE — Progress Notes (Unsigned)
ANNUAL EXAM Patient name: Kathryn Burton MRN [id number]  Date of birth: [birth year] Chief Complaint:   Annual Exam  History of Present Illness:   Kathryn Burton is a 62 y.o. G18P1011 Caucasian female being seen today for a routine annual exam.   Patient is doing well with no current concerns. 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   Patient's last menstrual period was 08/04/2012.   The pregnancy intention screening data noted above was reviewed. Potential methods of contraception were discussed. The patient elected to proceed with No data recorded.   Last pap 10/08/2019. Results were: NILM w/ HRHPV negative. H/O abnormal pap: yes CIN1 Last mammogram: 09/10/22. Results were: normal. Family h/o breast cancer: yes   Last colonoscopy: 03/09/2013. Results were: normal. Family h/o colorectal cancer: yes paternal grandmother. 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  BMD: 10/27/2018- osteopenia      12/13/2022   10:00 AM 12/07/2021    8:30 AM 12/04/2020    9:22 AM  Depression screen PHQ 2/9  Decreased Interest 0 0 0  Down, Depressed, Hopeless 0 1 0  PHQ - 2 Score 0 1 0         No data to display           Review of Systems:   Pertinent items are noted in HPI Denies any headaches, blurred vision, fatigue, shortness of breath, chest pain, abdominal pain, abnormal vaginal discharge/itching/odor/irritation, problems with periods, bowel movements, urination, or intercourse unless otherwise stated above. Pertinent History Reviewed:  Reviewed past medical,surgical, social and family history.  Reviewed problem list, medications and allergies. Physical Assessment:   Vitals:   12/13/22 0948  BP: 112/73  Pulse: 86  Weight: 130 lb 9.6 oz (59.2 kg)  Height: 5\' 5"  (1.651 m)  Body mass index is 21.73 kg/m.        Physical Examination:   General appearance - well appearing, and in no distress  Mental status -  alert, oriented to person, place, and time  Psych:  She has a normal mood and affect  Skin - warm and dry, normal color, no suspicious lesions noted  Chest - effort normal, all lung fields clear to auscultation bilaterally  Heart - normal rate and regular rhythm  Neck:  midline trachea, no thyromegaly or nodules  Breasts - breasts appear normal, no suspicious masses, no skin or nipple changes or  axillary nodes  Abdomen - soft, nontender, nondistended, no masses or organomegaly  Pelvic - VULVA: normal appearing vulva with no masses, tenderness or lesions. Solitary skin tag on L labia majora.  VAGINA: normal appearing vagina with normal color and discharge, no lesions. +Atrophy.  CERVIX: normal appearing cervix without discharge or lesions, no CMT  Thin prep pap HR HPV cotesting done.  UTERUS: uterus is felt to be normal size, shape, consistency and nontender   ADNEXA: No adnexal masses or tenderness noted.  Extremities:  No swelling or varicosities noted  Chaperone present for exam  No results found for this or any previous visit (from the past 24 hour(s)).  Assessment & Plan:   1. Well woman exam with routine gynecological exam -pap smear done today  -BMD: repeat 2025  -Colonoscopy: repeat 2025  -PHQ score: 0 -Vaccines reviewed/updated  3. Radial scar of breast 4. Family history of breast cancer -Breast MRI 2025  5. Atrophy of vagina   6. Blood tests for routine general physical examination - CBC - Comp  Met (CMET) - Lipid panel - TSH - Hemoglobin A1c  7. Cervical cancer screening -h/o CIN 1 - Cytology - PAP( [place])  8. Colon cancer screening - Ambulatory referral to Gastroenterology    Orders Placed This Encounter  Procedures   DG BONE DENSITY (DXA)   CBC   Comp Met (CMET)   Lipid panel   TSH   Hemoglobin A1c   Ambulatory referral to Gastroenterology    Meds: No orders of the defined types were placed in this encounter.   Follow-up: No follow-ups  on file.  Ralene Muskrat, PA-C 12/13/2022 12:12 PM

## 2022-12-14 LAB — LIPID PANEL
Chol/HDL Ratio: 3.3 {ratio} (ref 0.0–4.4)
Cholesterol, Total: 224 mg/dL — ABNORMAL HIGH (ref 100–199)
HDL: 67 mg/dL (ref >39)
LDL Chol Calc (NIH): 137 mg/dL — ABNORMAL HIGH (ref 0–99)
Triglycerides: 115 mg/dL (ref 0–149)
VLDL Cholesterol Cal: 20 mg/dL (ref 5–40)

## 2022-12-14 LAB — CYTOLOGY - PAP
Comment: NEGATIVE
Diagnosis: NEGATIVE
High risk HPV: NEGATIVE

## 2022-12-14 LAB — CBC
Hematocrit: 42 % (ref 34.0–46.6)
Hemoglobin: 14.1 g/dL (ref 11.1–15.9)
MCH: 30.7 pg (ref 26.6–33.0)
MCHC: 33.6 g/dL (ref 31.5–35.7)
MCV: 92 fL (ref 79–97)
Platelets: 224 10*3/uL (ref 150–450)
RBC: 4.59 x10E6/uL (ref 3.77–5.28)
RDW: 11.6 % — ABNORMAL LOW (ref 11.7–15.4)
WBC: 5.9 10*3/uL (ref 3.4–10.8)

## 2022-12-14 LAB — COMPREHENSIVE METABOLIC PANEL
ALT: 13 [IU]/L (ref 0–32)
AST: 17 [IU]/L (ref 0–40)
Albumin: 4.9 g/dL (ref 3.9–4.9)
Alkaline Phosphatase: 59 [IU]/L (ref 44–121)
BUN/Creatinine Ratio: 11 — ABNORMAL LOW (ref 12–28)
BUN: 9 mg/dL (ref 8–27)
Bilirubin Total: 0.5 mg/dL (ref 0.0–1.2)
CO2: 24 mmol/L (ref 20–29)
Calcium: 9.9 mg/dL (ref 8.7–10.3)
Chloride: 102 mmol/L (ref 96–106)
Creatinine, Ser: 0.79 mg/dL (ref 0.57–1.00)
Globulin, Total: 2.2 g/dL (ref 1.5–4.5)
Glucose: 89 mg/dL (ref 70–99)
Potassium: 4.4 mmol/L (ref 3.5–5.2)
Sodium: 141 mmol/L (ref 134–144)
Total Protein: 7.1 g/dL (ref 6.0–8.5)
eGFR: 85 mL/min/{1.73_m2} (ref >59)

## 2022-12-14 LAB — HEMOGLOBIN A1C
Est. average glucose Bld gHb Est-mCnc: 117 mg/dL
Hgb A1c MFr Bld: 5.7 % — ABNORMAL HIGH (ref 4.8–5.6)

## 2022-12-14 LAB — TSH: TSH: 1.54 u[IU]/mL (ref 0.450–4.500)

## 2022-12-21 ENCOUNTER — Encounter (HOSPITAL_BASED_OUTPATIENT_CLINIC_OR_DEPARTMENT_OTHER): Payer: Self-pay | Admitting: Obstetrics & Gynecology

## 2022-12-21 NOTE — Telephone Encounter (Signed)
Called pt in response to [employer]. DOB verified. Reviewed results with pt and recommended that she follow up with PCP regarding cholesterol and A1C labs for managing and following the levels. Pt reports that she has not seen her PCP in years. Offered pt referral to new PCP. Pt would like to see someone in [place].

## 2023-01-10 ENCOUNTER — Other Ambulatory Visit: Payer: Self-pay

## 2023-01-25 ENCOUNTER — Encounter (HOSPITAL_BASED_OUTPATIENT_CLINIC_OR_DEPARTMENT_OTHER): Payer: Self-pay | Admitting: Obstetrics & Gynecology

## 2023-02-15 ENCOUNTER — Encounter: Payer: Self-pay | Admitting: Gastroenterology

## 2023-03-01 ENCOUNTER — Ambulatory Visit (AMBULATORY_SURGERY_CENTER): Payer: 59

## 2023-03-01 VITALS — Ht 65.0 in | Wt 132.0 lb

## 2023-03-01 DIAGNOSIS — Z1211 Encounter for screening for malignant neoplasm of colon: Secondary | ICD-10-CM

## 2023-03-01 MED ORDER — SUFLAVE 178.7 G PO SOLR: 1.0000 | ORAL | 0 refills | Status: DC

## 2023-03-01 MED ORDER — ONDANSETRON HCL 4 MG PO TABS: 4.0000 mg | ORAL_TABLET | ORAL | 0 refills | Status: AC

## 2023-03-01 NOTE — Progress Notes (Signed)
 No egg or soy allergy known to patient  No issues known to pt with past sedation with any surgeries or procedures Patient denies ever being told they had issues or difficulty with intubation  No FH of Malignant Hyperthermia Pt is not on diet pills Pt is not on  home 02  Pt is not on blood thinners  Pt reports frequent periods of constipation. Instructions provided for extra miralax prior to procedure  No A fib or A flutter Have any cardiac testing pending--no  LOA: independent  Prep: Suflave   Patient's chart reviewed by Cathlyn Parsons CNRA prior to previsit and patient appropriate for the [facility].  Previsit completed and red dot placed by patient's name on their procedure day (on provider's schedule).     PV completed with patient. Prep instructions sent via mychart and home address.

## 2023-03-21 ENCOUNTER — Encounter: Payer: Self-pay | Admitting: Gastroenterology

## 2023-03-21 ENCOUNTER — Ambulatory Visit (AMBULATORY_SURGERY_CENTER): Payer: Self-pay | Admitting: Gastroenterology

## 2023-03-21 VITALS — BP 112/74 | HR 74 | Temp 97.5°F | Resp 17 | Ht 65.0 in | Wt 132.0 lb

## 2023-03-21 DIAGNOSIS — K621 Rectal polyp: Secondary | ICD-10-CM | POA: Diagnosis not present

## 2023-03-21 DIAGNOSIS — K644 Residual hemorrhoidal skin tags: Secondary | ICD-10-CM

## 2023-03-21 DIAGNOSIS — D128 Benign neoplasm of rectum: Secondary | ICD-10-CM

## 2023-03-21 DIAGNOSIS — K5730 Diverticulosis of large intestine without perforation or abscess without bleeding: Secondary | ICD-10-CM | POA: Diagnosis not present

## 2023-03-21 DIAGNOSIS — K648 Other hemorrhoids: Secondary | ICD-10-CM

## 2023-03-21 DIAGNOSIS — Z1211 Encounter for screening for malignant neoplasm of colon: Secondary | ICD-10-CM

## 2023-03-21 MED ORDER — SODIUM CHLORIDE 0.9 % IV SOLN: 500.0000 mL | INTRAVENOUS | Status: DC

## 2023-03-21 NOTE — Progress Notes (Signed)
 Called to room to assist during endoscopic procedure.  Patient ID and intended procedure confirmed with present staff. Received instructions for my participation in the procedure from the performing physician.

## 2023-03-21 NOTE — Progress Notes (Signed)
 To pacu, VSs. Report to [email or web address]

## 2023-03-21 NOTE — Progress Notes (Signed)
 [facility] History and Physical   Primary Care Physician:  Benita Stabile, MD   Reason for Procedure:  Colorectal cancer screening  Plan:    Screening colonoscopy with possible interventions as needed     HPI: Kathryn Burton is a very pleasant 63 y.o. female here for screening colonoscopy. Denies any nausea, vomiting, abdominal pain, melena or bright red blood per rectum  The risks and benefits as well as alternatives of endoscopic procedure(s) have been discussed and reviewed. All questions answered. The patient agrees to proceed.    Past Medical History:  Diagnosis Date   Abnormal Pap smear    Basal cell carcinoma    Broken ankle    left   Distal radial fracture    due to fall    Headache    IBS (irritable bowel syndrome)    Scoliosis    Torn rotator cuff    right shoulder-had cortisone injections    Past Surgical History:  Procedure Laterality Date   BREAST BIOPSY Left    BREAST EXCISIONAL BIOPSY Left 02/25/2015   excision of radial scar with calcs   BREAST LUMPECTOMY WITH RADIOACTIVE SEED LOCALIZATION Left 02/25/2015   Procedure: LEFT BREAST LUMPECTOMY WITH RADIOACTIVE SEED LOCALIZATION;  Surgeon: Emelia Loron, MD;  Location: [facility];  Service: General;  Laterality: Left;   CERVICAL BIOPSY     GYNECOLOGIC CRYOSURGERY     CIN I   HTA     polyp resection   KNEE CARTILAGE SURGERY     right knee   MOHS SURGERY     OPEN REDUCTION INTERNAL FIXATION (ORIF) DISTAL RADIAL FRACTURE Left 01/31/2018   Procedure: OPEN REDUCTION INTERNAL FIXATION (ORIF) DISTAL RADIAL FRACTURE;  Surgeon: Dominica Severin, MD;  Location: [facility];  Service: Orthopedics;  Laterality: Left;   RECTAL SURGERY     posterior/rectal sphincter repair    Prior to Admission medications   Medication Sig Start Date End Date Taking? Authorizing Provider  acetaminophen (TYLENOL) 500 MG tablet Take 500 mg by mouth every 6 (six) hours as needed.   Yes [provider]  fluticasone (FLONASE) 50 MCG/ACT nasal spray Place 1 spray into both nostrils daily.   Yes [provider]  ondansetron (ZOFRAN) 4 MG tablet Take 1 tablet (4 mg total) by mouth as directed. 03/01/23  Yes Neha Waight, Eleonore Chiquito, MD  ibuprofen (ADVIL,MOTRIN) 200 MG tablet Take 800 mg by mouth every 8 (eight) hours as needed (for pain.).     [provider]  Probiotic Product (PROBIOTIC DAILY PO) Take 1 capsule by mouth daily.     [provider]  TURMERIC PO Take 1 capsule by mouth daily with lunch. [employer] trader, Historical, MD    Current Outpatient Medications  Medication Sig Dispense Refill   acetaminophen (TYLENOL) 500 MG tablet Take 500 mg by mouth every 6 (six) hours as needed.     fluticasone (FLONASE) 50 MCG/ACT nasal spray Place 1 spray into both nostrils daily.     ondansetron (ZOFRAN) 4 MG tablet Take 1 tablet (4 mg total) by mouth as directed. 2 tablet 0   ibuprofen (ADVIL,MOTRIN) 200 MG tablet Take 800 mg by mouth every 8 (eight) hours as needed (for pain.).      Probiotic Product (PROBIOTIC DAILY PO) Take 1 capsule by mouth daily.      TURMERIC PO Take 1 capsule by mouth daily with lunch. Turmeric Supreme Extra Strength     Current Facility-Administered  Medications  Medication Dose Route Frequency Provider Last Rate Last Admin   0.9 %  sodium chloride infusion  500 mL Intravenous Continuous Jamiaya Bina, Eleonore Chiquito, MD        Allergies as of 03/21/2023 - Review Complete 03/21/2023  Allergen Reaction Noted   Other  01/30/2018    Family History  Problem Relation Age of Onset   Coronary artery disease Father 6   Breast cancer Sister    Breast cancer Maternal Aunt    Colon cancer Paternal Grandmother 86 - 67   Esophageal cancer Neg Hx    Stomach cancer Neg Hx    Rectal cancer Neg Hx     Social History   Socioeconomic History   Marital status: Married    Spouse name: Not on file   Number of children: Not on file    Years of education: Not on file   Highest education level: Not on file  Occupational History   Not on file  Tobacco Use   Smoking status: Never   Smokeless tobacco: Never  Vaping Use   Vaping status: Never Used  Substance and Sexual Activity   Alcohol use: No   Drug use: No   Sexual activity: Not Currently    Partners: Male    Birth control/protection: Post-menopausal    Comment: had ablation  Other Topics Concern   Not on file  Social History Narrative   Not on file   Social Drivers of Health   Financial Resource Strain: Not on file  Food Insecurity: Not on file  Transportation Needs: Not on file  Physical Activity: Not on file  Stress: Not on file  Social Connections: Not on file  Intimate Partner Violence: Not on file    Review of Systems:  All other review of systems negative except as mentioned in the HPI.  Physical Exam: Vital signs in last 24 hours: BP 120/80   Pulse 93   Temp (!) 97.5 F (36.4 C)   Resp 12   Ht 5\' 5"  (1.651 m)   Wt 132 lb (59.9 kg)   LMP 08/04/2012 Comment: two months just one time spotting  SpO2 96%   BMI 21.97 kg/m  General:   Alert, NAD Lungs:  Clear .   Heart:  Regular rate and rhythm Abdomen:  Soft, nontender and nondistended. Neuro/Psych:  Alert and cooperative. Normal mood and affect. A and O x 3  Reviewed labs, radiology imaging, old records and pertinent past GI work up  Patient is appropriate for planned procedure(s) and anesthesia in an ambulatory setting   K. Scherry Ran , MD [phone]

## 2023-03-21 NOTE — Op Note (Signed)
 [facility] Patient Name: Kathryn Burton Procedure Date: 03/21/2023 7:26 AM MRN: [id number] Endoscopist: Napoleon Form , MD, [id number] Age: 63 Referring MD:  Date of Birth: August 13, 1960 Gender: Female Account #: [id number] Procedure:                Colonoscopy Indications:              Screening for colorectal malignant neoplasm Medicines:                Monitored Anesthesia Care Procedure:                Pre-Anesthesia Assessment:                           - Prior to the procedure, a History and Physical                            was performed, and patient medications and                            allergies were reviewed. The patient's tolerance of                            previous anesthesia was also reviewed. The risks                            and benefits of the procedure and the sedation                            options and risks were discussed with the patient.                            All questions were answered, and informed consent                            was obtained. Prior Anticoagulants: The patient has                            taken no anticoagulant or antiplatelet agents. ASA                            Grade Assessment: II - A patient with mild systemic                            disease. After reviewing the risks and benefits,                            the patient was deemed in satisfactory condition to                            undergo the procedure.                           After obtaining informed consent, the colonoscope  was passed under direct vision. Throughout the                            procedure, the patient's blood pressure, pulse, and                            oxygen saturations were monitored continuously. The                            Olympus Scope [id number] was introduced through the                            anus and advanced to the the cecum, identified by                             appendiceal orifice and ileocecal valve. The                            colonoscopy was performed without difficulty. The                            patient tolerated the procedure well. The quality                            of the bowel preparation was good. The ileocecal                            valve, appendiceal orifice, and rectum were                            photographed. Scope In: 8:07:30 AM Scope Out: 8:25:15 AM Scope Withdrawal Time: 0 hours 12 minutes 36 seconds  Total Procedure Duration: 0 hours 17 minutes 45 seconds  Findings:                 The perianal and digital rectal examinations were                            normal.                           Scattered small-mouthed diverticula were found in                            the sigmoid colon, transverse colon and ascending                            colon.                           Four sessile polyps were found in the rectum. The                            polyps were 1 to 2 mm in size. These polyps were  removed with a cold biopsy forceps. Resection and                            retrieval were complete.                           Non-bleeding external and internal hemorrhoids were                            found during retroflexion. The hemorrhoids were                            medium-sized. Complications:            No immediate complications. Estimated Blood Loss:     Estimated blood loss was minimal. Impression:               - Mild diverticulosis in the sigmoid colon, in the                            transverse colon and in the ascending colon.                           - Four 1 to 2 mm polyps in the rectum, removed with                            a cold biopsy forceps. Resected and retrieved.                           - Non-bleeding external and internal hemorrhoids. Recommendation:           - Patient has a contact number available for                            emergencies. The  signs and symptoms of potential                            delayed complications were discussed with the                            patient. Return to normal activities tomorrow.                            Written discharge instructions were provided to the                            patient.                           - Resume previous diet.                           - Continue present medications.                           - Await pathology results.                           -  Repeat colonoscopy in 5-10 years for surveillance                            based on pathology results. Napoleon Form, MD 03/21/2023 8:32:57 AM This report has been signed electronically.

## 2023-03-21 NOTE — Patient Instructions (Signed)

## 2023-03-21 NOTE — Progress Notes (Signed)
 VS by [facility]  Pt's states no medical or surgical changes since previsit or office visit.

## 2023-03-22 ENCOUNTER — Telehealth: Payer: Self-pay

## 2023-03-22 NOTE — Telephone Encounter (Signed)
  Follow up Call-     03/21/2023    7:19 AM  Call back number  Post procedure Call Back phone  # [phone]  Permission to leave phone message Yes     Patient questions:  Do you have a fever, pain , or abdominal swelling? No. Pain Score  0 *  Have you tolerated food without any problems? Yes.    Have you been able to return to your normal activities? Yes.    Do you have any questions about your discharge instructions: Diet   No. Medications  No. Follow up visit  No.  Do you have questions or concerns about your Care? No.  Actions: * If pain score is 4 or above: No action needed, pain <4.

## 2023-03-23 LAB — SURGICAL PATHOLOGY

## 2023-03-28 ENCOUNTER — Encounter (HOSPITAL_BASED_OUTPATIENT_CLINIC_OR_DEPARTMENT_OTHER): Payer: Self-pay | Admitting: Obstetrics & Gynecology

## 2023-04-15 ENCOUNTER — Other Ambulatory Visit (HOSPITAL_BASED_OUTPATIENT_CLINIC_OR_DEPARTMENT_OTHER): Payer: Self-pay | Admitting: Obstetrics & Gynecology

## 2023-04-15 DIAGNOSIS — Z803 Family history of malignant neoplasm of breast: Secondary | ICD-10-CM

## 2023-04-15 DIAGNOSIS — Z9189 Other specified personal risk factors, not elsewhere classified: Secondary | ICD-10-CM

## 2023-05-10 ENCOUNTER — Encounter (HOSPITAL_BASED_OUTPATIENT_CLINIC_OR_DEPARTMENT_OTHER): Payer: Self-pay | Admitting: Obstetrics & Gynecology

## 2023-05-11 ENCOUNTER — Encounter (HOSPITAL_BASED_OUTPATIENT_CLINIC_OR_DEPARTMENT_OTHER): Payer: Self-pay | Admitting: Obstetrics & Gynecology

## 2023-05-12 ENCOUNTER — Encounter (HOSPITAL_BASED_OUTPATIENT_CLINIC_OR_DEPARTMENT_OTHER): Payer: Self-pay | Admitting: Obstetrics & Gynecology

## 2023-05-16 ENCOUNTER — Encounter: Payer: Self-pay | Admitting: Gastroenterology

## 2023-05-16 ENCOUNTER — Encounter (HOSPITAL_BASED_OUTPATIENT_CLINIC_OR_DEPARTMENT_OTHER): Payer: Self-pay | Admitting: Obstetrics & Gynecology

## 2023-06-07 ENCOUNTER — Ambulatory Visit
Admission: RE | Admit: 2023-06-07 | Discharge: 2023-06-07 | Disposition: A | Discharge status: Home / Self Care | Source: Ambulatory Visit | Attending: Obstetrics & Gynecology | Admitting: Obstetrics & Gynecology

## 2023-06-07 DIAGNOSIS — Z9189 Other specified personal risk factors, not elsewhere classified: Secondary | ICD-10-CM

## 2023-06-07 DIAGNOSIS — Z803 Family history of malignant neoplasm of breast: Secondary | ICD-10-CM

## 2023-06-07 MED ORDER — GADOPICLENOL 0.5 MMOL/ML IV SOLN
6.0000 mL | Freq: Once | INTRAVENOUS | Status: AC | PRN
  Administered 2023-06-07: 6 mL via INTRAVENOUS

## 2023-06-10 ENCOUNTER — Ambulatory Visit (HOSPITAL_BASED_OUTPATIENT_CLINIC_OR_DEPARTMENT_OTHER): Payer: Self-pay | Admitting: Obstetrics & Gynecology

## 2023-06-10 ENCOUNTER — Encounter (HOSPITAL_BASED_OUTPATIENT_CLINIC_OR_DEPARTMENT_OTHER): Payer: Self-pay | Admitting: Obstetrics & Gynecology

## 2023-07-18 ENCOUNTER — Other Ambulatory Visit: Payer: Self-pay

## 2023-07-18 ENCOUNTER — Ambulatory Visit (HOSPITAL_COMMUNITY)
Admission: RE | Admit: 2023-07-18 | Discharge: 2023-07-18 | Disposition: A | Discharge status: Home / Self Care | Payer: Self-pay | Source: Ambulatory Visit | Attending: Physician Assistant | Admitting: Physician Assistant

## 2023-07-18 DIAGNOSIS — Z01419 Encounter for gynecological examination (general) (routine) without abnormal findings: Secondary | ICD-10-CM | POA: Insufficient documentation

## 2023-07-28 ENCOUNTER — Encounter (HOSPITAL_BASED_OUTPATIENT_CLINIC_OR_DEPARTMENT_OTHER): Payer: Self-pay | Admitting: Obstetrics & Gynecology

## 2023-07-30 ENCOUNTER — Other Ambulatory Visit (HOSPITAL_BASED_OUTPATIENT_CLINIC_OR_DEPARTMENT_OTHER): Payer: Self-pay | Admitting: Obstetrics & Gynecology

## 2023-07-30 ENCOUNTER — Encounter (HOSPITAL_BASED_OUTPATIENT_CLINIC_OR_DEPARTMENT_OTHER): Payer: Self-pay | Admitting: Obstetrics & Gynecology

## 2023-07-30 DIAGNOSIS — M818 Other osteoporosis without current pathological fracture: Secondary | ICD-10-CM

## 2023-07-30 NOTE — Telephone Encounter (Signed)
 Called pt and discussed BMD results showing osteoporosis now.  She is not taking calcium or Vit D so this was discussed.  She is very active but specific weight bearing exercises discussed.  Treatment options reviewed as well as osteoporosis fracture clinic.  Information about treatment will be sent via mychart per her request.  Questions answered.  She will get back with me after doing some research.

## 2023-08-22 ENCOUNTER — Ambulatory Visit: Admitting: Sports Medicine

## 2023-08-30 NOTE — Progress Notes (Deleted)
    Kathryn Burton D.Kathryn Burton [facility] [address] [place] [address] Phone: [phone]   Assessment and Plan:         ***   Pertinent previous records reviewed include ***    Follow Up: ***     Subjective:    Chief Complaint: ***  HPI:   08/30/23 ***  Relevant Historical Information: ***  Additional pertinent review of systems negative.   Current Outpatient Medications:    acetaminophen  (TYLENOL ) 500 MG tablet, Take 500 mg by mouth every 6 (six) hours as needed., Disp: , Rfl:    fluticasone (FLONASE) 50 MCG/ACT nasal spray, Place 1 spray into both nostrils daily., Disp: , Rfl:    ibuprofen (ADVIL,MOTRIN) 200 MG tablet, Take 800 mg by mouth every 8 (eight) hours as needed (for pain.). , Disp: , Rfl:    ondansetron  (ZOFRAN ) 4 MG tablet, Take 1 tablet (4 mg total) by mouth as directed., Disp: 2 tablet, Rfl: 0   Probiotic Product (PROBIOTIC DAILY PO), Take 1 capsule by mouth daily. , Disp: , Rfl:    TURMERIC PO, Take 1 capsule by mouth daily with lunch. Turmeric Supreme Extra Strength, Disp: , Rfl:    Objective:     There were no vitals filed for this visit.    There is no height or weight on file to calculate BMI.    Physical Exam:    ***   Electronically signed by:  Odis Mace D.Kathryn Burton [facility] 7:48 AM 08/30/23

## 2023-08-31 ENCOUNTER — Ambulatory Visit: Admitting: Sports Medicine

## 2023-12-22 NOTE — Progress Notes (Unsigned)
° °  ANNUAL EXAM Patient name: Kathryn Burton MRN [id number]  Date of birth: 1960-11-04 Chief Complaint:   No chief complaint on file.  History of Present Illness:   Kathryn Burton is a 63 y.o. G47P1011 Caucasian female being seen today for a routine annual exam.  Current complaints: ***  Patient's last menstrual period was 08/04/2012.   The pregnancy intention screening data noted above was reviewed. Potential methods of contraception were discussed. The patient elected to proceed with No data recorded.   Last pap 12/13/2022. Results were: NILM w/ HRHPV negative. H/O abnormal pap: {yes/yes***/no:23866} Last mammogram: 09/10/2022. Results were: normal. Family h/o breast cancer: {yes***/no:23838} Last colonoscopy: 03/21/2023. Results were: abnormal four polyps. Family h/o colorectal cancer: {yes***/no:23838}     12/13/2022   10:00 AM 12/07/2021    8:30 AM 12/04/2020    9:22 AM  Depression screen PHQ 2/9  Decreased Interest 0 0 0  Down, Depressed, Hopeless 0 1 0  PHQ - 2 Score 0 1 0         No data to display           Review of Systems:   Pertinent items are noted in HPI Denies any headaches, blurred vision, fatigue, shortness of breath, chest pain, abdominal pain, abnormal vaginal discharge/itching/odor/irritation, problems with periods, bowel movements, urination, or intercourse unless otherwise stated above. Pertinent History Reviewed:  Reviewed past medical,surgical, social and family history.  Reviewed problem list, medications and allergies. Physical Assessment:  There were no vitals filed for this visit.There is no height or weight on file to calculate BMI.        Physical Examination:   General appearance - well appearing, and in no distress  Mental status - alert, oriented to person, place, and time  Psych:  She has a normal mood and affect  Skin - warm and dry, normal color, no suspicious lesions noted  Chest - effort normal, all lung fields clear to  auscultation bilaterally  Heart - normal rate and regular rhythm  Neck:  midline trachea, no thyromegaly or nodules  Breasts - breasts appear normal, no suspicious masses, no skin or nipple changes or  axillary nodes  Abdomen - soft, nontender, nondistended, no masses or organomegaly  Pelvic - VULVA: normal appearing vulva with no masses, tenderness or lesions  VAGINA: normal appearing vagina with normal color and discharge, no lesions  CERVIX: normal appearing cervix without discharge or lesions, no CMT  Thin prep pap is {Desc; done/not:10129} *** HR HPV cotesting  UTERUS: uterus is felt to be normal size, shape, consistency and nontender   ADNEXA: No adnexal masses or tenderness noted.  Rectal - normal rectal, good sphincter tone, no masses felt. Hemoccult: ***  Extremities:  No swelling or varicosities noted  Chaperone present for exam  No results found for this or any previous visit (from the past 24 hours).  Assessment & Plan:  1) Well-Woman Exam  2) ***  Labs/procedures today: ***  Mammogram: {Mammo f/u:25212::@ 63yo}, or sooner if problems Colonoscopy: {TCS f/u:25213::@ 63yo}, or sooner if problems  No orders of the defined types were placed in this encounter.   Meds: No orders of the defined types were placed in this encounter.   Follow-up: No follow-ups on file.  Morna LOISE Quale, RN 12/22/2023 2:41 PM

## 2023-12-23 ENCOUNTER — Encounter (HOSPITAL_BASED_OUTPATIENT_CLINIC_OR_DEPARTMENT_OTHER): Payer: Self-pay | Admitting: Obstetrics & Gynecology

## 2023-12-23 ENCOUNTER — Ambulatory Visit (INDEPENDENT_AMBULATORY_CARE_PROVIDER_SITE_OTHER): Payer: BC Managed Care – PPO | Admitting: Obstetrics & Gynecology

## 2023-12-23 VITALS — BP 115/75 | HR 89 | Wt 133.6 lb

## 2023-12-23 DIAGNOSIS — N952 Postmenopausal atrophic vaginitis: Secondary | ICD-10-CM | POA: Diagnosis not present

## 2023-12-23 DIAGNOSIS — Z01419 Encounter for gynecological examination (general) (routine) without abnormal findings: Secondary | ICD-10-CM | POA: Diagnosis not present

## 2023-12-23 DIAGNOSIS — Z1231 Encounter for screening mammogram for malignant neoplasm of breast: Secondary | ICD-10-CM | POA: Diagnosis not present

## 2023-12-23 DIAGNOSIS — Z87410 Personal history of cervical dysplasia: Secondary | ICD-10-CM | POA: Diagnosis not present

## 2023-12-23 DIAGNOSIS — Z1331 Encounter for screening for depression: Secondary | ICD-10-CM | POA: Diagnosis not present

## 2023-12-23 DIAGNOSIS — M816 Localized osteoporosis [Lequesne]: Secondary | ICD-10-CM

## 2023-12-23 DIAGNOSIS — Z1239 Encounter for other screening for malignant neoplasm of breast: Secondary | ICD-10-CM

## 2023-12-23 DIAGNOSIS — N6489 Other specified disorders of breast: Secondary | ICD-10-CM
